# Patient Record
Sex: Male | Born: 1935 | Hispanic: No | Marital: Married | Smoking: Former smoker
Health system: Southern US, Community
[De-identification: ages and names within clinical notes are randomized; demographics above are authoritative.]

## PROBLEM LIST (undated history)

## (undated) DIAGNOSIS — H6983 Other specified disorders of Eustachian tube, bilateral: Secondary | ICD-10-CM

## (undated) DIAGNOSIS — M79662 Pain in left lower leg: Secondary | ICD-10-CM

## (undated) DIAGNOSIS — R0600 Dyspnea, unspecified: Secondary | ICD-10-CM

## (undated) DIAGNOSIS — E7800 Pure hypercholesterolemia, unspecified: Secondary | ICD-10-CM

## (undated) DIAGNOSIS — R635 Abnormal weight gain: Secondary | ICD-10-CM

## (undated) DIAGNOSIS — E785 Hyperlipidemia, unspecified: Secondary | ICD-10-CM

## (undated) DIAGNOSIS — H6123 Impacted cerumen, bilateral: Secondary | ICD-10-CM

## (undated) DIAGNOSIS — Z6828 Body mass index (BMI) 28.0-28.9, adult: Secondary | ICD-10-CM

## (undated) DIAGNOSIS — I10 Essential (primary) hypertension: Secondary | ICD-10-CM

## (undated) DIAGNOSIS — E1169 Type 2 diabetes mellitus with other specified complication: Secondary | ICD-10-CM

## (undated) DIAGNOSIS — Z8719 Personal history of other diseases of the digestive system: Secondary | ICD-10-CM

## (undated) DIAGNOSIS — N1831 Chronic kidney disease, stage 3a: Secondary | ICD-10-CM

## (undated) DIAGNOSIS — R55 Syncope and collapse: Secondary | ICD-10-CM

## (undated) DIAGNOSIS — N529 Male erectile dysfunction, unspecified: Secondary | ICD-10-CM

## (undated) DIAGNOSIS — I7090 Unspecified atherosclerosis: Secondary | ICD-10-CM

## (undated) DIAGNOSIS — H8109 Meniere's disease, unspecified ear: Secondary | ICD-10-CM

## (undated) DIAGNOSIS — I700 Atherosclerosis of aorta: Secondary | ICD-10-CM

## (undated) DIAGNOSIS — I2510 Atherosclerotic heart disease of native coronary artery without angina pectoris: Secondary | ICD-10-CM

## (undated) DIAGNOSIS — M7989 Other specified soft tissue disorders: Secondary | ICD-10-CM

## (undated) DIAGNOSIS — Z6829 Body mass index (BMI) 29.0-29.9, adult: Secondary | ICD-10-CM

## (undated) DIAGNOSIS — J309 Allergic rhinitis, unspecified: Secondary | ICD-10-CM

## (undated) DIAGNOSIS — H6993 Unspecified Eustachian tube disorder, bilateral: Secondary | ICD-10-CM

## (undated) DIAGNOSIS — I951 Orthostatic hypotension: Secondary | ICD-10-CM

## (undated) DIAGNOSIS — I6529 Occlusion and stenosis of unspecified carotid artery: Secondary | ICD-10-CM

## (undated) DIAGNOSIS — A779 Spotted fever, unspecified: Secondary | ICD-10-CM

## (undated) DIAGNOSIS — R5383 Other fatigue: Secondary | ICD-10-CM

## (undated) DIAGNOSIS — E041 Nontoxic single thyroid nodule: Secondary | ICD-10-CM

## (undated) DIAGNOSIS — R001 Bradycardia, unspecified: Secondary | ICD-10-CM

## (undated) DIAGNOSIS — K5909 Other constipation: Secondary | ICD-10-CM

## (undated) HISTORY — DX: Atherosclerosis of aorta: I70.0

## (undated) HISTORY — DX: Bradycardia, unspecified: R00.1

## (undated) HISTORY — DX: Hyperlipidemia, unspecified: E78.5

## (undated) HISTORY — DX: Meniere's disease, unspecified ear: H81.09

## (undated) HISTORY — DX: Occlusion and stenosis of unspecified carotid artery: I65.29

## (undated) HISTORY — DX: Male erectile dysfunction, unspecified: N52.9

## (undated) HISTORY — DX: Nontoxic single thyroid nodule: E04.1

## (undated) HISTORY — DX: Atherosclerotic heart disease of native coronary artery without angina pectoris: I25.10

## (undated) HISTORY — DX: Impacted cerumen, bilateral: H61.23

## (undated) HISTORY — PX: CHOLECYSTECTOMY, LAPAROSCOPIC: SHX56

## (undated) HISTORY — PX: HERNIA REPAIR: SHX51

## (undated) HISTORY — DX: Allergic rhinitis, unspecified: J30.9

## (undated) HISTORY — DX: Other constipation: K59.09

## (undated) HISTORY — DX: Other specified soft tissue disorders: M79.662

## (undated) HISTORY — DX: Other specified disorders of eustachian tube, bilateral: H69.83

## (undated) HISTORY — DX: Other fatigue: R53.83

## (undated) HISTORY — DX: Orthostatic hypotension: I95.1

## (undated) HISTORY — DX: Other specified soft tissue disorders: M79.89

## (undated) HISTORY — DX: Body mass index (BMI) 29.0-29.9, adult: Z68.29

## (undated) HISTORY — DX: Abnormal weight gain: R63.5

## (undated) HISTORY — DX: Type 2 diabetes mellitus with other specified complication: E11.69

## (undated) HISTORY — DX: Body mass index (BMI) 28.0-28.9, adult: Z68.28

## (undated) HISTORY — DX: Chronic kidney disease, stage 3a: N18.31

## (undated) HISTORY — DX: Unspecified eustachian tube disorder, bilateral: H69.93

## (undated) HISTORY — DX: Syncope and collapse: R55

## (undated) HISTORY — PX: CARDIAC CATHETERIZATION: SHX172

## (undated) HISTORY — DX: Unspecified atherosclerosis: I70.90

## (undated) HISTORY — DX: Spotted fever, unspecified: A77.9

## (undated) HISTORY — DX: Pure hypercholesterolemia, unspecified: E78.00

---

## 2015-07-11 DIAGNOSIS — J069 Acute upper respiratory infection, unspecified: Secondary | ICD-10-CM | POA: Diagnosis not present

## 2015-07-11 DIAGNOSIS — R0981 Nasal congestion: Secondary | ICD-10-CM | POA: Diagnosis not present

## 2015-09-19 DIAGNOSIS — I10 Essential (primary) hypertension: Secondary | ICD-10-CM | POA: Diagnosis not present

## 2015-09-19 DIAGNOSIS — E785 Hyperlipidemia, unspecified: Secondary | ICD-10-CM | POA: Diagnosis not present

## 2015-09-19 DIAGNOSIS — I2510 Atherosclerotic heart disease of native coronary artery without angina pectoris: Secondary | ICD-10-CM | POA: Diagnosis not present

## 2015-09-19 DIAGNOSIS — M25552 Pain in left hip: Secondary | ICD-10-CM | POA: Diagnosis not present

## 2015-09-19 DIAGNOSIS — H6123 Impacted cerumen, bilateral: Secondary | ICD-10-CM | POA: Diagnosis not present

## 2015-09-19 DIAGNOSIS — Z0000 Encounter for general adult medical examination without abnormal findings: Secondary | ICD-10-CM | POA: Diagnosis not present

## 2015-09-26 DIAGNOSIS — M545 Low back pain: Secondary | ICD-10-CM | POA: Diagnosis not present

## 2015-09-26 DIAGNOSIS — S239XXA Sprain of unspecified parts of thorax, initial encounter: Secondary | ICD-10-CM | POA: Diagnosis not present

## 2015-11-25 DIAGNOSIS — Z0000 Encounter for general adult medical examination without abnormal findings: Secondary | ICD-10-CM | POA: Diagnosis not present

## 2015-11-25 DIAGNOSIS — M791 Myalgia: Secondary | ICD-10-CM | POA: Diagnosis not present

## 2015-11-25 DIAGNOSIS — E785 Hyperlipidemia, unspecified: Secondary | ICD-10-CM | POA: Diagnosis not present

## 2015-11-25 DIAGNOSIS — Z79899 Other long term (current) drug therapy: Secondary | ICD-10-CM | POA: Diagnosis not present

## 2016-01-23 DIAGNOSIS — L259 Unspecified contact dermatitis, unspecified cause: Secondary | ICD-10-CM | POA: Diagnosis not present

## 2016-04-27 DIAGNOSIS — I10 Essential (primary) hypertension: Secondary | ICD-10-CM | POA: Diagnosis not present

## 2016-04-27 DIAGNOSIS — R10813 Right lower quadrant abdominal tenderness: Secondary | ICD-10-CM | POA: Diagnosis not present

## 2016-04-27 DIAGNOSIS — E7800 Pure hypercholesterolemia, unspecified: Secondary | ICD-10-CM | POA: Diagnosis not present

## 2016-04-27 DIAGNOSIS — R109 Unspecified abdominal pain: Secondary | ICD-10-CM | POA: Diagnosis not present

## 2016-04-27 DIAGNOSIS — Z7982 Long term (current) use of aspirin: Secondary | ICD-10-CM | POA: Diagnosis not present

## 2016-04-27 DIAGNOSIS — Z87891 Personal history of nicotine dependence: Secondary | ICD-10-CM | POA: Diagnosis not present

## 2016-04-27 DIAGNOSIS — Z888 Allergy status to other drugs, medicaments and biological substances status: Secondary | ICD-10-CM | POA: Diagnosis not present

## 2016-04-27 DIAGNOSIS — K2970 Gastritis, unspecified, without bleeding: Secondary | ICD-10-CM | POA: Diagnosis not present

## 2016-04-27 DIAGNOSIS — Z79899 Other long term (current) drug therapy: Secondary | ICD-10-CM | POA: Diagnosis not present

## 2016-04-27 DIAGNOSIS — K851 Biliary acute pancreatitis without necrosis or infection: Secondary | ICD-10-CM | POA: Diagnosis not present

## 2016-04-27 DIAGNOSIS — R1031 Right lower quadrant pain: Secondary | ICD-10-CM | POA: Diagnosis not present

## 2016-04-27 DIAGNOSIS — K8020 Calculus of gallbladder without cholecystitis without obstruction: Secondary | ICD-10-CM | POA: Diagnosis not present

## 2016-04-27 DIAGNOSIS — R1084 Generalized abdominal pain: Secondary | ICD-10-CM | POA: Diagnosis not present

## 2016-04-27 DIAGNOSIS — R112 Nausea with vomiting, unspecified: Secondary | ICD-10-CM | POA: Diagnosis not present

## 2016-04-27 DIAGNOSIS — R55 Syncope and collapse: Secondary | ICD-10-CM | POA: Diagnosis not present

## 2016-04-27 DIAGNOSIS — R0602 Shortness of breath: Secondary | ICD-10-CM | POA: Diagnosis not present

## 2016-04-27 DIAGNOSIS — K812 Acute cholecystitis with chronic cholecystitis: Secondary | ICD-10-CM | POA: Diagnosis not present

## 2016-04-27 DIAGNOSIS — R110 Nausea: Secondary | ICD-10-CM | POA: Diagnosis not present

## 2016-04-27 DIAGNOSIS — K8010 Calculus of gallbladder with chronic cholecystitis without obstruction: Secondary | ICD-10-CM | POA: Diagnosis not present

## 2016-04-27 DIAGNOSIS — K810 Acute cholecystitis: Secondary | ICD-10-CM | POA: Diagnosis not present

## 2016-04-29 DIAGNOSIS — K8010 Calculus of gallbladder with chronic cholecystitis without obstruction: Secondary | ICD-10-CM | POA: Diagnosis not present

## 2016-05-11 DIAGNOSIS — Z09 Encounter for follow-up examination after completed treatment for conditions other than malignant neoplasm: Secondary | ICD-10-CM

## 2016-05-11 HISTORY — DX: Encounter for follow-up examination after completed treatment for conditions other than malignant neoplasm: Z09

## 2016-06-17 DIAGNOSIS — K648 Other hemorrhoids: Secondary | ICD-10-CM | POA: Diagnosis not present

## 2016-06-17 DIAGNOSIS — K5730 Diverticulosis of large intestine without perforation or abscess without bleeding: Secondary | ICD-10-CM | POA: Diagnosis not present

## 2016-06-17 DIAGNOSIS — K5900 Constipation, unspecified: Secondary | ICD-10-CM | POA: Diagnosis not present

## 2016-07-29 DIAGNOSIS — K5730 Diverticulosis of large intestine without perforation or abscess without bleeding: Secondary | ICD-10-CM | POA: Diagnosis not present

## 2016-07-29 DIAGNOSIS — K648 Other hemorrhoids: Secondary | ICD-10-CM | POA: Diagnosis not present

## 2016-07-29 DIAGNOSIS — K5900 Constipation, unspecified: Secondary | ICD-10-CM | POA: Diagnosis not present

## 2016-09-02 DIAGNOSIS — E7800 Pure hypercholesterolemia, unspecified: Secondary | ICD-10-CM | POA: Diagnosis not present

## 2016-09-02 DIAGNOSIS — E8881 Metabolic syndrome: Secondary | ICD-10-CM | POA: Diagnosis not present

## 2016-09-02 DIAGNOSIS — I2510 Atherosclerotic heart disease of native coronary artery without angina pectoris: Secondary | ICD-10-CM | POA: Diagnosis not present

## 2016-09-02 DIAGNOSIS — E041 Nontoxic single thyroid nodule: Secondary | ICD-10-CM | POA: Diagnosis not present

## 2016-09-02 DIAGNOSIS — R0982 Postnasal drip: Secondary | ICD-10-CM | POA: Diagnosis not present

## 2016-09-02 DIAGNOSIS — H6123 Impacted cerumen, bilateral: Secondary | ICD-10-CM | POA: Diagnosis not present

## 2016-09-02 DIAGNOSIS — Z125 Encounter for screening for malignant neoplasm of prostate: Secondary | ICD-10-CM | POA: Diagnosis not present

## 2016-09-02 DIAGNOSIS — J309 Allergic rhinitis, unspecified: Secondary | ICD-10-CM | POA: Diagnosis not present

## 2016-09-02 DIAGNOSIS — I10 Essential (primary) hypertension: Secondary | ICD-10-CM | POA: Diagnosis not present

## 2017-04-18 DIAGNOSIS — R600 Localized edema: Secondary | ICD-10-CM | POA: Diagnosis not present

## 2017-04-18 DIAGNOSIS — S8002XA Contusion of left knee, initial encounter: Secondary | ICD-10-CM | POA: Diagnosis not present

## 2017-04-19 DIAGNOSIS — S8002XA Contusion of left knee, initial encounter: Secondary | ICD-10-CM | POA: Diagnosis not present

## 2017-04-19 DIAGNOSIS — S8992XA Unspecified injury of left lower leg, initial encounter: Secondary | ICD-10-CM | POA: Diagnosis not present

## 2017-04-19 DIAGNOSIS — M7989 Other specified soft tissue disorders: Secondary | ICD-10-CM | POA: Diagnosis not present

## 2017-04-19 DIAGNOSIS — M79662 Pain in left lower leg: Secondary | ICD-10-CM | POA: Diagnosis not present

## 2017-04-19 DIAGNOSIS — S9032XA Contusion of left foot, initial encounter: Secondary | ICD-10-CM | POA: Diagnosis not present

## 2017-04-19 DIAGNOSIS — L03116 Cellulitis of left lower limb: Secondary | ICD-10-CM | POA: Diagnosis not present

## 2017-04-26 DIAGNOSIS — J019 Acute sinusitis, unspecified: Secondary | ICD-10-CM | POA: Diagnosis not present

## 2017-04-26 DIAGNOSIS — J029 Acute pharyngitis, unspecified: Secondary | ICD-10-CM | POA: Diagnosis not present

## 2017-04-26 DIAGNOSIS — Z6830 Body mass index (BMI) 30.0-30.9, adult: Secondary | ICD-10-CM | POA: Diagnosis not present

## 2017-04-26 DIAGNOSIS — H938X1 Other specified disorders of right ear: Secondary | ICD-10-CM | POA: Diagnosis not present

## 2017-04-26 DIAGNOSIS — H6121 Impacted cerumen, right ear: Secondary | ICD-10-CM | POA: Diagnosis not present

## 2017-04-26 DIAGNOSIS — J069 Acute upper respiratory infection, unspecified: Secondary | ICD-10-CM | POA: Diagnosis not present

## 2017-05-01 DIAGNOSIS — M25569 Pain in unspecified knee: Secondary | ICD-10-CM | POA: Diagnosis not present

## 2017-05-05 DIAGNOSIS — M79662 Pain in left lower leg: Secondary | ICD-10-CM | POA: Diagnosis not present

## 2017-05-05 DIAGNOSIS — N401 Enlarged prostate with lower urinary tract symptoms: Secondary | ICD-10-CM | POA: Diagnosis not present

## 2017-05-05 DIAGNOSIS — R351 Nocturia: Secondary | ICD-10-CM | POA: Diagnosis not present

## 2017-05-05 DIAGNOSIS — M25462 Effusion, left knee: Secondary | ICD-10-CM | POA: Diagnosis not present

## 2017-05-05 DIAGNOSIS — M25569 Pain in unspecified knee: Secondary | ICD-10-CM | POA: Diagnosis not present

## 2017-05-05 DIAGNOSIS — S8992XD Unspecified injury of left lower leg, subsequent encounter: Secondary | ICD-10-CM | POA: Diagnosis not present

## 2017-05-05 DIAGNOSIS — Z6830 Body mass index (BMI) 30.0-30.9, adult: Secondary | ICD-10-CM | POA: Diagnosis not present

## 2017-05-05 DIAGNOSIS — M7989 Other specified soft tissue disorders: Secondary | ICD-10-CM | POA: Diagnosis not present

## 2017-05-05 DIAGNOSIS — E7800 Pure hypercholesterolemia, unspecified: Secondary | ICD-10-CM | POA: Diagnosis not present

## 2017-05-19 DIAGNOSIS — M79605 Pain in left leg: Secondary | ICD-10-CM | POA: Diagnosis not present

## 2017-05-19 DIAGNOSIS — M7989 Other specified soft tissue disorders: Secondary | ICD-10-CM | POA: Diagnosis not present

## 2017-05-19 DIAGNOSIS — Z6830 Body mass index (BMI) 30.0-30.9, adult: Secondary | ICD-10-CM | POA: Diagnosis not present

## 2017-05-19 DIAGNOSIS — S8992XD Unspecified injury of left lower leg, subsequent encounter: Secondary | ICD-10-CM | POA: Diagnosis not present

## 2017-05-24 DIAGNOSIS — H81399 Other peripheral vertigo, unspecified ear: Secondary | ICD-10-CM | POA: Diagnosis not present

## 2017-05-24 DIAGNOSIS — J324 Chronic pansinusitis: Secondary | ICD-10-CM | POA: Diagnosis not present

## 2017-05-24 DIAGNOSIS — S8001XA Contusion of right knee, initial encounter: Secondary | ICD-10-CM | POA: Diagnosis not present

## 2017-07-07 DIAGNOSIS — H6983 Other specified disorders of Eustachian tube, bilateral: Secondary | ICD-10-CM | POA: Diagnosis not present

## 2017-07-07 DIAGNOSIS — R42 Dizziness and giddiness: Secondary | ICD-10-CM | POA: Diagnosis not present

## 2017-07-07 DIAGNOSIS — H8109 Meniere's disease, unspecified ear: Secondary | ICD-10-CM | POA: Diagnosis not present

## 2017-07-07 DIAGNOSIS — Z6829 Body mass index (BMI) 29.0-29.9, adult: Secondary | ICD-10-CM | POA: Diagnosis not present

## 2017-07-19 DIAGNOSIS — Z6829 Body mass index (BMI) 29.0-29.9, adult: Secondary | ICD-10-CM | POA: Diagnosis not present

## 2017-07-19 DIAGNOSIS — I10 Essential (primary) hypertension: Secondary | ICD-10-CM | POA: Diagnosis not present

## 2017-09-17 DIAGNOSIS — M7061 Trochanteric bursitis, right hip: Secondary | ICD-10-CM | POA: Diagnosis not present

## 2017-09-17 DIAGNOSIS — Z6830 Body mass index (BMI) 30.0-30.9, adult: Secondary | ICD-10-CM | POA: Diagnosis not present

## 2017-09-17 DIAGNOSIS — I10 Essential (primary) hypertension: Secondary | ICD-10-CM | POA: Diagnosis not present

## 2017-09-17 DIAGNOSIS — Z1331 Encounter for screening for depression: Secondary | ICD-10-CM | POA: Diagnosis not present

## 2017-09-20 DIAGNOSIS — Z131 Encounter for screening for diabetes mellitus: Secondary | ICD-10-CM | POA: Diagnosis not present

## 2017-09-20 DIAGNOSIS — Z79899 Other long term (current) drug therapy: Secondary | ICD-10-CM | POA: Diagnosis not present

## 2017-09-20 DIAGNOSIS — E7800 Pure hypercholesterolemia, unspecified: Secondary | ICD-10-CM | POA: Diagnosis not present

## 2017-09-20 DIAGNOSIS — E785 Hyperlipidemia, unspecified: Secondary | ICD-10-CM | POA: Diagnosis not present

## 2017-10-04 DIAGNOSIS — E041 Nontoxic single thyroid nodule: Secondary | ICD-10-CM | POA: Diagnosis not present

## 2017-10-04 DIAGNOSIS — N401 Enlarged prostate with lower urinary tract symptoms: Secondary | ICD-10-CM | POA: Diagnosis not present

## 2017-10-04 DIAGNOSIS — I10 Essential (primary) hypertension: Secondary | ICD-10-CM | POA: Diagnosis not present

## 2017-10-04 DIAGNOSIS — Z0000 Encounter for general adult medical examination without abnormal findings: Secondary | ICD-10-CM | POA: Diagnosis not present

## 2017-10-04 DIAGNOSIS — E785 Hyperlipidemia, unspecified: Secondary | ICD-10-CM | POA: Diagnosis not present

## 2017-10-04 DIAGNOSIS — Z125 Encounter for screening for malignant neoplasm of prostate: Secondary | ICD-10-CM | POA: Diagnosis not present

## 2017-10-04 DIAGNOSIS — E8881 Metabolic syndrome: Secondary | ICD-10-CM | POA: Diagnosis not present

## 2017-12-07 DIAGNOSIS — Z1331 Encounter for screening for depression: Secondary | ICD-10-CM | POA: Diagnosis not present

## 2017-12-07 DIAGNOSIS — Z6831 Body mass index (BMI) 31.0-31.9, adult: Secondary | ICD-10-CM | POA: Diagnosis not present

## 2017-12-07 DIAGNOSIS — M545 Low back pain: Secondary | ICD-10-CM | POA: Diagnosis not present

## 2018-01-03 DIAGNOSIS — Z6830 Body mass index (BMI) 30.0-30.9, adult: Secondary | ICD-10-CM | POA: Diagnosis not present

## 2018-01-03 DIAGNOSIS — I10 Essential (primary) hypertension: Secondary | ICD-10-CM | POA: Diagnosis not present

## 2018-01-03 DIAGNOSIS — M545 Low back pain: Secondary | ICD-10-CM | POA: Diagnosis not present

## 2018-01-03 DIAGNOSIS — E7800 Pure hypercholesterolemia, unspecified: Secondary | ICD-10-CM | POA: Diagnosis not present

## 2018-04-18 DIAGNOSIS — E8881 Metabolic syndrome: Secondary | ICD-10-CM | POA: Diagnosis not present

## 2018-04-18 DIAGNOSIS — N401 Enlarged prostate with lower urinary tract symptoms: Secondary | ICD-10-CM | POA: Diagnosis not present

## 2018-04-18 DIAGNOSIS — H9203 Otalgia, bilateral: Secondary | ICD-10-CM | POA: Diagnosis not present

## 2018-04-18 DIAGNOSIS — I10 Essential (primary) hypertension: Secondary | ICD-10-CM | POA: Diagnosis not present

## 2018-04-18 DIAGNOSIS — I2510 Atherosclerotic heart disease of native coronary artery without angina pectoris: Secondary | ICD-10-CM | POA: Diagnosis not present

## 2018-04-18 DIAGNOSIS — R351 Nocturia: Secondary | ICD-10-CM | POA: Diagnosis not present

## 2018-04-18 DIAGNOSIS — R635 Abnormal weight gain: Secondary | ICD-10-CM | POA: Diagnosis not present

## 2018-04-18 DIAGNOSIS — Z6831 Body mass index (BMI) 31.0-31.9, adult: Secondary | ICD-10-CM | POA: Diagnosis not present

## 2018-04-18 DIAGNOSIS — M545 Low back pain: Secondary | ICD-10-CM | POA: Diagnosis not present

## 2018-06-16 DIAGNOSIS — J111 Influenza due to unidentified influenza virus with other respiratory manifestations: Secondary | ICD-10-CM | POA: Diagnosis not present

## 2018-06-16 DIAGNOSIS — Z6831 Body mass index (BMI) 31.0-31.9, adult: Secondary | ICD-10-CM | POA: Diagnosis not present

## 2018-11-04 DIAGNOSIS — E7800 Pure hypercholesterolemia, unspecified: Secondary | ICD-10-CM | POA: Diagnosis not present

## 2018-11-04 DIAGNOSIS — Z79899 Other long term (current) drug therapy: Secondary | ICD-10-CM | POA: Diagnosis not present

## 2018-11-04 DIAGNOSIS — Z0000 Encounter for general adult medical examination without abnormal findings: Secondary | ICD-10-CM | POA: Diagnosis not present

## 2018-11-04 DIAGNOSIS — E785 Hyperlipidemia, unspecified: Secondary | ICD-10-CM | POA: Diagnosis not present

## 2018-11-04 DIAGNOSIS — Z125 Encounter for screening for malignant neoplasm of prostate: Secondary | ICD-10-CM | POA: Diagnosis not present

## 2018-11-04 DIAGNOSIS — E041 Nontoxic single thyroid nodule: Secondary | ICD-10-CM | POA: Diagnosis not present

## 2018-11-04 DIAGNOSIS — I2510 Atherosclerotic heart disease of native coronary artery without angina pectoris: Secondary | ICD-10-CM | POA: Diagnosis not present

## 2018-11-04 DIAGNOSIS — I10 Essential (primary) hypertension: Secondary | ICD-10-CM | POA: Diagnosis not present

## 2018-11-04 DIAGNOSIS — E8881 Metabolic syndrome: Secondary | ICD-10-CM | POA: Diagnosis not present

## 2018-11-24 DIAGNOSIS — Z6831 Body mass index (BMI) 31.0-31.9, adult: Secondary | ICD-10-CM | POA: Diagnosis not present

## 2018-11-24 DIAGNOSIS — K5900 Constipation, unspecified: Secondary | ICD-10-CM | POA: Diagnosis not present

## 2018-11-30 DIAGNOSIS — K5730 Diverticulosis of large intestine without perforation or abscess without bleeding: Secondary | ICD-10-CM | POA: Diagnosis not present

## 2018-11-30 DIAGNOSIS — K5900 Constipation, unspecified: Secondary | ICD-10-CM | POA: Diagnosis not present

## 2018-11-30 DIAGNOSIS — R140 Abdominal distension (gaseous): Secondary | ICD-10-CM | POA: Diagnosis not present

## 2018-11-30 DIAGNOSIS — Z6831 Body mass index (BMI) 31.0-31.9, adult: Secondary | ICD-10-CM | POA: Diagnosis not present

## 2018-11-30 DIAGNOSIS — R1906 Epigastric swelling, mass or lump: Secondary | ICD-10-CM | POA: Diagnosis not present

## 2019-02-22 DIAGNOSIS — Z6831 Body mass index (BMI) 31.0-31.9, adult: Secondary | ICD-10-CM | POA: Diagnosis not present

## 2019-02-22 DIAGNOSIS — H8109 Meniere's disease, unspecified ear: Secondary | ICD-10-CM | POA: Diagnosis not present

## 2019-02-22 DIAGNOSIS — I951 Orthostatic hypotension: Secondary | ICD-10-CM | POA: Diagnosis not present

## 2019-02-22 DIAGNOSIS — Z1331 Encounter for screening for depression: Secondary | ICD-10-CM | POA: Diagnosis not present

## 2019-02-22 DIAGNOSIS — R197 Diarrhea, unspecified: Secondary | ICD-10-CM | POA: Diagnosis not present

## 2019-03-08 DIAGNOSIS — Z6831 Body mass index (BMI) 31.0-31.9, adult: Secondary | ICD-10-CM | POA: Diagnosis not present

## 2019-03-08 DIAGNOSIS — I951 Orthostatic hypotension: Secondary | ICD-10-CM | POA: Diagnosis not present

## 2019-03-08 DIAGNOSIS — E8881 Metabolic syndrome: Secondary | ICD-10-CM | POA: Diagnosis not present

## 2019-03-08 DIAGNOSIS — R197 Diarrhea, unspecified: Secondary | ICD-10-CM | POA: Diagnosis not present

## 2019-04-08 DIAGNOSIS — Z20828 Contact with and (suspected) exposure to other viral communicable diseases: Secondary | ICD-10-CM | POA: Diagnosis not present

## 2019-04-08 DIAGNOSIS — R519 Headache, unspecified: Secondary | ICD-10-CM | POA: Diagnosis not present

## 2019-04-14 DIAGNOSIS — I10 Essential (primary) hypertension: Secondary | ICD-10-CM | POA: Diagnosis not present

## 2019-04-14 DIAGNOSIS — R0902 Hypoxemia: Secondary | ICD-10-CM | POA: Diagnosis not present

## 2019-04-14 DIAGNOSIS — R410 Disorientation, unspecified: Secondary | ICD-10-CM | POA: Diagnosis not present

## 2019-04-14 DIAGNOSIS — E7800 Pure hypercholesterolemia, unspecified: Secondary | ICD-10-CM | POA: Diagnosis not present

## 2019-04-14 DIAGNOSIS — U071 COVID-19: Secondary | ICD-10-CM | POA: Diagnosis not present

## 2019-04-14 DIAGNOSIS — Z888 Allergy status to other drugs, medicaments and biological substances status: Secondary | ICD-10-CM | POA: Diagnosis not present

## 2019-04-14 DIAGNOSIS — R0602 Shortness of breath: Secondary | ICD-10-CM | POA: Diagnosis not present

## 2019-04-14 DIAGNOSIS — J1289 Other viral pneumonia: Secondary | ICD-10-CM | POA: Diagnosis not present

## 2019-04-14 DIAGNOSIS — R069 Unspecified abnormalities of breathing: Secondary | ICD-10-CM | POA: Diagnosis not present

## 2019-04-14 DIAGNOSIS — Z79899 Other long term (current) drug therapy: Secondary | ICD-10-CM | POA: Diagnosis not present

## 2019-04-14 DIAGNOSIS — Z7982 Long term (current) use of aspirin: Secondary | ICD-10-CM | POA: Diagnosis not present

## 2019-04-14 DIAGNOSIS — J189 Pneumonia, unspecified organism: Secondary | ICD-10-CM | POA: Diagnosis not present

## 2019-04-14 DIAGNOSIS — R000 Tachycardia, unspecified: Secondary | ICD-10-CM | POA: Diagnosis not present

## 2019-04-14 DIAGNOSIS — J9601 Acute respiratory failure with hypoxia: Secondary | ICD-10-CM | POA: Diagnosis not present

## 2019-04-14 DIAGNOSIS — R918 Other nonspecific abnormal finding of lung field: Secondary | ICD-10-CM | POA: Diagnosis not present

## 2019-04-16 ENCOUNTER — Encounter (HOSPITAL_COMMUNITY): Payer: Self-pay | Admitting: Internal Medicine

## 2019-04-16 ENCOUNTER — Inpatient Hospital Stay (HOSPITAL_COMMUNITY)
Admission: AD | Admit: 2019-04-16 | Discharge: 2019-04-20 | DRG: 177 | Disposition: A | Discharge status: Home Health | Payer: Medicare HMO | Source: Other Acute Inpatient Hospital | Attending: Internal Medicine | Admitting: Internal Medicine

## 2019-04-16 ENCOUNTER — Other Ambulatory Visit: Payer: Self-pay

## 2019-04-16 DIAGNOSIS — I509 Heart failure, unspecified: Secondary | ICD-10-CM | POA: Diagnosis not present

## 2019-04-16 DIAGNOSIS — M2550 Pain in unspecified joint: Secondary | ICD-10-CM | POA: Diagnosis not present

## 2019-04-16 DIAGNOSIS — J1282 Pneumonia due to coronavirus disease 2019: Secondary | ICD-10-CM | POA: Diagnosis not present

## 2019-04-16 DIAGNOSIS — J9600 Acute respiratory failure, unspecified whether with hypoxia or hypercapnia: Secondary | ICD-10-CM

## 2019-04-16 DIAGNOSIS — R0902 Hypoxemia: Secondary | ICD-10-CM | POA: Diagnosis not present

## 2019-04-16 DIAGNOSIS — R52 Pain, unspecified: Secondary | ICD-10-CM | POA: Diagnosis not present

## 2019-04-16 DIAGNOSIS — I2510 Atherosclerotic heart disease of native coronary artery without angina pectoris: Secondary | ICD-10-CM | POA: Diagnosis present

## 2019-04-16 DIAGNOSIS — J9601 Acute respiratory failure with hypoxia: Secondary | ICD-10-CM | POA: Diagnosis not present

## 2019-04-16 DIAGNOSIS — R0602 Shortness of breath: Secondary | ICD-10-CM | POA: Diagnosis not present

## 2019-04-16 DIAGNOSIS — U071 COVID-19: Secondary | ICD-10-CM | POA: Diagnosis not present

## 2019-04-16 DIAGNOSIS — E7800 Pure hypercholesterolemia, unspecified: Secondary | ICD-10-CM | POA: Diagnosis not present

## 2019-04-16 DIAGNOSIS — Z888 Allergy status to other drugs, medicaments and biological substances status: Secondary | ICD-10-CM | POA: Diagnosis not present

## 2019-04-16 DIAGNOSIS — R5381 Other malaise: Secondary | ICD-10-CM | POA: Diagnosis not present

## 2019-04-16 DIAGNOSIS — E785 Hyperlipidemia, unspecified: Secondary | ICD-10-CM | POA: Diagnosis present

## 2019-04-16 DIAGNOSIS — I110 Hypertensive heart disease with heart failure: Secondary | ICD-10-CM | POA: Diagnosis not present

## 2019-04-16 DIAGNOSIS — R531 Weakness: Secondary | ICD-10-CM | POA: Diagnosis not present

## 2019-04-16 DIAGNOSIS — G92 Toxic encephalopathy: Secondary | ICD-10-CM | POA: Diagnosis not present

## 2019-04-16 DIAGNOSIS — Z87891 Personal history of nicotine dependence: Secondary | ICD-10-CM | POA: Diagnosis not present

## 2019-04-16 DIAGNOSIS — I10 Essential (primary) hypertension: Secondary | ICD-10-CM | POA: Diagnosis present

## 2019-04-16 DIAGNOSIS — K449 Diaphragmatic hernia without obstruction or gangrene: Secondary | ICD-10-CM | POA: Diagnosis not present

## 2019-04-16 DIAGNOSIS — I959 Hypotension, unspecified: Secondary | ICD-10-CM | POA: Diagnosis present

## 2019-04-16 DIAGNOSIS — Z743 Need for continuous supervision: Secondary | ICD-10-CM | POA: Diagnosis not present

## 2019-04-16 DIAGNOSIS — R279 Unspecified lack of coordination: Secondary | ICD-10-CM | POA: Diagnosis not present

## 2019-04-16 DIAGNOSIS — Z7401 Bed confinement status: Secondary | ICD-10-CM | POA: Diagnosis not present

## 2019-04-16 HISTORY — DX: Atherosclerotic heart disease of native coronary artery without angina pectoris: I25.10

## 2019-04-16 HISTORY — DX: Dyspnea, unspecified: R06.00

## 2019-04-16 HISTORY — DX: Personal history of other diseases of the digestive system: Z87.19

## 2019-04-16 HISTORY — DX: Hyperlipidemia, unspecified: E78.5

## 2019-04-16 HISTORY — DX: Essential (primary) hypertension: I10

## 2019-04-16 HISTORY — DX: Pneumonia due to coronavirus disease 2019: J12.82

## 2019-04-16 HISTORY — DX: COVID-19: U07.1

## 2019-04-16 MED ORDER — ACETAMINOPHEN 325 MG PO TABS: 650.0000 mg | ORAL_TABLET | Freq: Four times a day (QID) | ORAL | Status: DC | PRN

## 2019-04-16 MED ORDER — ASCORBIC ACID 500 MG PO TABS
500.0000 mg | ORAL_TABLET | Freq: Every day | ORAL | Status: DC
  Administered 2019-04-17 – 2019-04-20 (×4): 500 mg via ORAL
  Filled 2019-04-16 (×4): qty 1

## 2019-04-16 MED ORDER — SODIUM CHLORIDE 0.9 % IV SOLN: 100.0000 mg | Freq: Every day | INTRAVENOUS | Status: DC

## 2019-04-16 MED ORDER — GUAIFENESIN-DM 100-10 MG/5ML PO SYRP
10.0000 mL | ORAL_SOLUTION | ORAL | Status: DC | PRN
  Administered 2019-04-19: 10 mL via ORAL
  Filled 2019-04-16: qty 10

## 2019-04-16 MED ORDER — SODIUM CHLORIDE 0.9% FLUSH
3.0000 mL | Freq: Two times a day (BID) | INTRAVENOUS | Status: DC
  Administered 2019-04-16: 3 mL via INTRAVENOUS

## 2019-04-16 MED ORDER — ONDANSETRON HCL 4 MG/2ML IJ SOLN: 4.0000 mg | Freq: Four times a day (QID) | INTRAMUSCULAR | Status: DC | PRN

## 2019-04-16 MED ORDER — SODIUM CHLORIDE 0.9 % IV SOLN: 200.0000 mg | Freq: Once | INTRAVENOUS | Status: DC

## 2019-04-16 MED ORDER — ZINC SULFATE 220 (50 ZN) MG PO CAPS
220.0000 mg | ORAL_CAPSULE | Freq: Every day | ORAL | Status: DC
  Administered 2019-04-17 – 2019-04-20 (×4): 220 mg via ORAL
  Filled 2019-04-16 (×4): qty 1

## 2019-04-16 MED ORDER — ENOXAPARIN SODIUM 40 MG/0.4ML ~~LOC~~ SOLN: 40.0000 mg | SUBCUTANEOUS | Status: DC

## 2019-04-16 MED ORDER — ONDANSETRON HCL 4 MG PO TABS: 4.0000 mg | ORAL_TABLET | Freq: Four times a day (QID) | ORAL | Status: DC | PRN

## 2019-04-16 MED ORDER — DEXAMETHASONE SODIUM PHOSPHATE 10 MG/ML IJ SOLN
6.0000 mg | INTRAMUSCULAR | Status: DC
  Administered 2019-04-17 – 2019-04-18 (×2): 6 mg via INTRAVENOUS
  Filled 2019-04-16: qty 1

## 2019-04-16 MED ORDER — SODIUM CHLORIDE 0.9 % IV SOLN
100.0000 mg | Freq: Every day | INTRAVENOUS | Status: AC
  Administered 2019-04-17 – 2019-04-19 (×3): 100 mg via INTRAVENOUS
  Filled 2019-04-16 (×3): qty 20

## 2019-04-16 MED ORDER — SODIUM CHLORIDE 0.9% FLUSH: 3.0000 mL | INTRAVENOUS | Status: DC | PRN

## 2019-04-16 MED ORDER — HYDROCOD POLST-CPM POLST ER 10-8 MG/5ML PO SUER
5.0000 mL | Freq: Two times a day (BID) | ORAL | Status: DC | PRN
  Administered 2019-04-20: 5 mL via ORAL
  Filled 2019-04-16: qty 5

## 2019-04-16 MED ORDER — SODIUM CHLORIDE 0.9 % IV SOLN: 250.0000 mL | INTRAVENOUS | Status: DC | PRN

## 2019-04-16 NOTE — Progress Notes (Signed)
Spoke to Adam Brady at [facility] to notify that patient needs Covid positive result faxed over prior to admission to [facility].

## 2019-04-17 ENCOUNTER — Inpatient Hospital Stay (HOSPITAL_COMMUNITY): Payer: Medicare HMO

## 2019-04-17 DIAGNOSIS — U071 COVID-19: Principal | ICD-10-CM

## 2019-04-17 DIAGNOSIS — J1282 Pneumonia due to coronavirus disease 2019: Secondary | ICD-10-CM

## 2019-04-17 LAB — CBC
HCT: 47.3 % (ref 39.0–52.0)
Hemoglobin: 16.2 g/dL (ref 13.0–17.0)
MCH: 32.3 pg (ref 26.0–34.0)
MCHC: 34.2 g/dL (ref 30.0–36.0)
MCV: 94.4 fL (ref 80.0–100.0)
Platelets: 282 10*3/uL (ref 150–400)
RBC: 5.01 MIL/uL (ref 4.22–5.81)
RDW: 12.9 % (ref 11.5–15.5)
WBC: 9.5 10*3/uL (ref 4.0–10.5)
nRBC: 0 % (ref 0.0–0.2)

## 2019-04-17 LAB — URINALYSIS, ROUTINE W REFLEX MICROSCOPIC
Bacteria, UA: NONE SEEN
Bilirubin Urine: NEGATIVE
Glucose, UA: >=500 mg/dL — AB
Hgb urine dipstick: NEGATIVE
Ketones, ur: NEGATIVE mg/dL
Leukocytes,Ua: NEGATIVE
Nitrite: NEGATIVE
Protein, ur: NEGATIVE mg/dL
Specific Gravity, Urine: 1.021 (ref 1.005–1.030)
pH: 6 (ref 5.0–8.0)

## 2019-04-17 LAB — TYPE AND SCREEN
ABO/RH(D): O POS
Antibody Screen: NEGATIVE

## 2019-04-17 LAB — HEPATITIS B SURFACE ANTIGEN: Hepatitis B Surface Ag: NONREACTIVE

## 2019-04-17 LAB — BASIC METABOLIC PANEL
Anion gap: 9 (ref 5–15)
BUN: 23 mg/dL (ref 8–23)
CO2: 26 mmol/L (ref 22–32)
Calcium: 9.1 mg/dL (ref 8.9–10.3)
Chloride: 101 mmol/L (ref 98–111)
Creatinine, Ser: 0.95 mg/dL (ref 0.61–1.24)
GFR calc Af Amer: >60 mL/min (ref >60)
GFR calc non Af Amer: >60 mL/min (ref >60)
Glucose, Bld: 342 mg/dL — ABNORMAL HIGH (ref 70–99)
Potassium: 3.8 mmol/L (ref 3.5–5.1)
Sodium: 136 mmol/L (ref 135–145)

## 2019-04-17 LAB — D-DIMER, QUANTITATIVE: D-Dimer, Quant: 3.36 ug/mL-FEU — ABNORMAL HIGH (ref 0.00–0.50)

## 2019-04-17 LAB — MAGNESIUM: Magnesium: 2 mg/dL (ref 1.7–2.4)

## 2019-04-17 LAB — PROCALCITONIN: Procalcitonin: <0.1 ng/mL

## 2019-04-17 LAB — ABO/RH: ABO/RH(D): O POS

## 2019-04-17 LAB — C-REACTIVE PROTEIN: CRP: 2.6 mg/dL — ABNORMAL HIGH (ref <1.0)

## 2019-04-17 LAB — BRAIN NATRIURETIC PEPTIDE: B Natriuretic Peptide: 61.8 pg/mL (ref 0.0–100.0)

## 2019-04-17 LAB — GLUCOSE, CAPILLARY
Glucose-Capillary: 347 mg/dL — ABNORMAL HIGH (ref 70–99)
Glucose-Capillary: 287 mg/dL — ABNORMAL HIGH (ref 70–99)

## 2019-04-17 MED ORDER — TAMSULOSIN HCL 0.4 MG PO CAPS
0.4000 mg | ORAL_CAPSULE | Freq: Every day | ORAL | Status: DC
  Administered 2019-04-17 – 2019-04-20 (×4): 0.4 mg via ORAL
  Filled 2019-04-17 (×4): qty 1

## 2019-04-17 MED ORDER — ENOXAPARIN SODIUM 60 MG/0.6ML ~~LOC~~ SOLN
50.0000 mg | Freq: Two times a day (BID) | SUBCUTANEOUS | Status: DC
  Administered 2019-04-17: 50 mg via SUBCUTANEOUS
  Filled 2019-04-17: qty 0.6

## 2019-04-17 MED ORDER — ENOXAPARIN SODIUM 60 MG/0.6ML ~~LOC~~ SOLN
50.0000 mg | SUBCUTANEOUS | Status: DC
  Administered 2019-04-17 – 2019-04-18 (×2): 50 mg via SUBCUTANEOUS
  Filled 2019-04-17 (×2): qty 0.6

## 2019-04-17 MED ORDER — INSULIN ASPART 100 UNIT/ML ~~LOC~~ SOLN
0.0000 [IU] | Freq: Every day | SUBCUTANEOUS | Status: DC
  Administered 2019-04-17: 3 [IU] via SUBCUTANEOUS
  Administered 2019-04-18: 5 [IU] via SUBCUTANEOUS
  Administered 2019-04-19: 2 [IU] via SUBCUTANEOUS

## 2019-04-17 MED ORDER — LACTATED RINGERS IV SOLN: INTRAVENOUS | Status: AC

## 2019-04-17 MED ORDER — LORAZEPAM 2 MG/ML IJ SOLN
0.5000 mg | Freq: Once | INTRAMUSCULAR | Status: AC
  Administered 2019-04-17: 0.5 mg via INTRAVENOUS
  Filled 2019-04-17: qty 1

## 2019-04-17 MED ORDER — HALOPERIDOL LACTATE 5 MG/ML IJ SOLN
2.0000 mg | Freq: Four times a day (QID) | INTRAMUSCULAR | Status: DC | PRN
  Administered 2019-04-17: 2 mg via INTRAVENOUS
  Filled 2019-04-17: qty 1

## 2019-04-17 MED ORDER — SIMVASTATIN 20 MG PO TABS
20.0000 mg | ORAL_TABLET | Freq: Every day | ORAL | Status: DC
  Administered 2019-04-18 – 2019-04-19 (×2): 20 mg via ORAL
  Filled 2019-04-17 (×2): qty 1

## 2019-04-17 MED ORDER — ASPIRIN 81 MG PO CHEW
81.0000 mg | CHEWABLE_TABLET | Freq: Every day | ORAL | Status: DC
  Administered 2019-04-17 – 2019-04-20 (×4): 81 mg via ORAL
  Filled 2019-04-17 (×4): qty 1

## 2019-04-17 MED ORDER — HALOPERIDOL LACTATE 5 MG/ML IJ SOLN
2.0000 mg | Freq: Once | INTRAMUSCULAR | Status: AC
  Administered 2019-04-17: 2 mg via INTRAVENOUS
  Filled 2019-04-17: qty 1

## 2019-04-17 MED ORDER — INSULIN ASPART 100 UNIT/ML ~~LOC~~ SOLN
0.0000 [IU] | Freq: Three times a day (TID) | SUBCUTANEOUS | Status: DC
  Administered 2019-04-17: 11 [IU] via SUBCUTANEOUS
  Administered 2019-04-18: 5 [IU] via SUBCUTANEOUS
  Administered 2019-04-18: 3 [IU] via SUBCUTANEOUS
  Administered 2019-04-18 – 2019-04-19 (×2): 11 [IU] via SUBCUTANEOUS
  Administered 2019-04-19 (×2): 3 [IU] via SUBCUTANEOUS
  Administered 2019-04-20: 2 [IU] via SUBCUTANEOUS
  Administered 2019-04-20: 8 [IU] via SUBCUTANEOUS

## 2019-04-17 MED ORDER — MIDODRINE HCL 5 MG PO TABS
5.0000 mg | ORAL_TABLET | Freq: Two times a day (BID) | ORAL | Status: AC
  Administered 2019-04-17 – 2019-04-18 (×4): 5 mg via ORAL
  Filled 2019-04-17 (×4): qty 1

## 2019-04-17 MED ORDER — FLUTICASONE PROPIONATE 50 MCG/ACT NA SUSP
2.0000 | Freq: Every day | NASAL | Status: DC | PRN
  Filled 2019-04-17: qty 16

## 2019-04-17 NOTE — Plan of Care (Signed)
  Problem: Education: Goal: Knowledge of General Education information will improve Description: Including pain rating scale, medication(s)/side effects and non-pharmacologic comfort measures 04/17/2019 1236 by Garrison Columbus, RN Outcome: Progressing 04/17/2019 1232 by Garrison Columbus, RN Outcome: Progressing   Problem: Health Behavior/Discharge Planning: Goal: Ability to manage health-related needs will improve 04/17/2019 1236 by Garrison Columbus, RN Outcome: Progressing 04/17/2019 1232 by Garrison Columbus, RN Outcome: Progressing   Problem: Clinical Measurements: Goal: Ability to maintain clinical measurements within normal limits will improve 04/17/2019 1236 by Garrison Columbus, RN Outcome: Progressing 04/17/2019 1232 by Garrison Columbus, RN Outcome: Progressing Goal: Will remain free from infection 04/17/2019 1236 by Garrison Columbus, RN Outcome: Progressing 04/17/2019 1232 by Garrison Columbus, RN Outcome: Progressing Goal: Diagnostic test results will improve 04/17/2019 1236 by Garrison Columbus, RN Outcome: Progressing 04/17/2019 1232 by Garrison Columbus, RN Outcome: Progressing Goal: Respiratory complications will improve 04/17/2019 1236 by Garrison Columbus, RN Outcome: Progressing 04/17/2019 1232 by Garrison Columbus, RN Outcome: Progressing Goal: Cardiovascular complication will be avoided 04/17/2019 1236 by Garrison Columbus, RN Outcome: Progressing 04/17/2019 1232 by Garrison Columbus, RN Outcome: Progressing   Problem: Activity: Goal: Risk for activity intolerance will decrease 04/17/2019 1236 by Garrison Columbus, RN Outcome: Progressing 04/17/2019 1232 by Garrison Columbus, RN Outcome: Progressing   Problem: Nutrition: Goal: Adequate nutrition will be maintained 04/17/2019 1236 by Garrison Columbus, RN Outcome: Progressing 04/17/2019 1232 by Garrison Columbus, RN Outcome: Progressing   Problem: Coping: Goal: Level of anxiety will decrease 04/17/2019 1236 by Garrison Columbus, RN Outcome: Progressing 04/17/2019 1232 by Garrison Columbus, RN Outcome: Progressing   Problem: Elimination: Goal: Will not experience complications related to bowel motility 04/17/2019 1236 by Garrison Columbus, RN Outcome: Progressing 04/17/2019 1232 by Garrison Columbus, RN Outcome: Progressing Goal: Will not experience complications related to urinary retention 04/17/2019 1236 by Garrison Columbus, RN Outcome: Progressing 04/17/2019 1232 by Garrison Columbus, RN Outcome: Progressing   Problem: Pain Managment: Goal: General experience of comfort will improve 04/17/2019 1236 by Garrison Columbus, RN Outcome: Progressing 04/17/2019 1232 by Garrison Columbus, RN Outcome: Progressing   Problem: Safety: Goal: Ability to remain free from injury will improve 04/17/2019 1236 by Garrison Columbus, RN Outcome: Progressing 04/17/2019 1232 by Garrison Columbus, RN Outcome: Progressing   Problem: Skin Integrity: Goal: Risk for impaired skin integrity will decrease 04/17/2019 1236 by Garrison Columbus, RN Outcome: Progressing 04/17/2019 1232 by Garrison Columbus, RN Outcome: Progressing   Problem: Education: Goal: Knowledge of risk factors and measures for prevention of condition will improve 04/17/2019 1236 by Garrison Columbus, RN Outcome: Progressing 04/17/2019 1232 by Garrison Columbus, RN Outcome: Progressing   Problem: Coping: Goal: Psychosocial and spiritual needs will be supported 04/17/2019 1236 by Garrison Columbus, RN Outcome: Progressing 04/17/2019 1232 by Garrison Columbus, RN Outcome: Progressing   Problem: Respiratory: Goal: Will maintain a patent airway 04/17/2019 1236 by Garrison Columbus, RN Outcome: Progressing 04/17/2019 1232 by Garrison Columbus, RN Outcome: Progressing Goal: Complications related to the disease process, condition or treatment will be avoided or minimized 04/17/2019 1236 by Garrison Columbus, RN Outcome: Progressing 04/17/2019 1232 by Garrison Columbus, RN Outcome: Progressing

## 2019-04-17 NOTE — Progress Notes (Signed)
Singh,MD notified of pt bladder scan of 656, MD placed order for foley and started pt on flomax.  Pt urinated prior to placing foley, MD notified and said not to place foley but instead to bladder scan Qshift.

## 2019-04-17 NOTE — Evaluation (Signed)
Physical Therapy Evaluation Patient Details Name: Adam Brady MRN: [id number] DOB: 1933-12-24 Today's Date: 04/17/2019   History of Present Illness  Adam Brady is a 86 y.o. male with medical history significant of hypertension, hyperlipidemia, coronary artery disease admitted for shortness of breath was 80% on room air initially had been on 4 L of oxygen with O2 sats of 90%.  Patient was admitted to [facility] received remdesivir Decadron and Actemra x1.  He progressively got worse requiring more oxygen needs 5 to 6 L and became more confused transferred to [facility] for further management.  Clinical Impression  The patient presents with significant change from being independent PTA, per son. . The patient was pleasant, assisted to sitting  On bed edge with decreased balance. Unable to attempt standing as needing 2 to assist, patient also becoming slightly more restless.   Assisted back to bed and secured the soft wrist restraints for patient safety. Sitter at bedside. Pt admitted with above diagnosis.  Pt currently with functional limitations due to the deficits listed below (see PT Problem List). Pt will benefit from skilled PT to increase their independence and safety with mobility to allow discharge to the venue listed below.       Follow Up Recommendations Home health PT;SNF(depends on progress)    Equipment Recommendations  Rolling walker with 5" wheels    Recommendations for Other Services       Precautions / Restrictions Precautions Precautions: Fall Precaution Comments: Has been confused and agitated, was not so much with PT this visit      Mobility  Bed Mobility Overal bed mobility: Needs Assistance Bed Mobility: Supine to Sit;Sit to Supine     Supine to sit: Mod assist Sit to supine: Min assist   General bed mobility comments: extra time and multimodal cues to sit up to bed edge after wrists released. Required assistance  for  trunk as tending to lean posteriorly  and never did sit near midline. patient did place legs onto bed to return to supine and able to slide himself up to [facility].  Transfers                 General transfer comment: did not attempt due to needing +2 and posterior leaning.  Ambulation/Gait                Stairs            Wheelchair Mobility    Modified Rankin (Stroke Patients Only)       Balance Overall balance assessment: Needs assistance Sitting-balance support: Bilateral upper extremity supported;Feet supported Sitting balance-Leahy Scale: Poor Sitting balance - Comments: leaning posteriorly attempted forward reaching. Postural control: Posterior lean                                   Pertinent Vitals/Pain      Home Living Family/patient expects to be discharged to:: Private residence Living Arrangements: Spouse/significant other Available Help at Discharge: Family Type of Home: House Home Access: Stairs to enter Entrance Stairs-Rails: Can reach both Entrance Stairs-Number of Steps: 6-back, front 1/2 atep Home Layout: One level Home Equipment: None      Prior Function Level of Independence: Independent         Comments: drives, recently remarried     Hand Dominance   Dominant Hand: Right    Extremity/Trunk Assessment   Upper Extremity Assessment Upper Extremity Assessment: Defer to OT  evaluation    Lower Extremity Assessment Lower Extremity Assessment: Generalized weakness    Cervical / Trunk Assessment Cervical / Trunk Assessment: Normal  Communication   Communication: No difficulties  Cognition     Overall Cognitive Status: Impaired/Different from baseline Area of Impairment: Orientation;Attention;Memory;Following commands;Safety/judgement;Awareness                 Orientation Level: Place;Time;Situation Current Attention Level: Selective Memory: Decreased recall of precautions Following Commands: Follows one step commands with increased  time Safety/Judgement: Decreased awareness of deficits;Decreased awareness of safety Awareness: Emergent   General Comments: patient stated it was january, 2020, He was in [facility], appeared to be describing " [facility] but could not make sense., At times asking why his hands were in a restraint, explaained about safety      General Comments      Exercises     Assessment/Plan    PT Assessment Patient needs continued PT services  PT Problem List Decreased strength;Decreased mobility;Decreased safety awareness;Decreased knowledge of precautions;Decreased activity tolerance;Decreased cognition;Decreased balance       PT Treatment Interventions DME instruction;Therapeutic activities;Cognitive remediation;Gait training;Therapeutic exercise;Patient/family education;Functional mobility training    PT Goals (Current goals can be found in the Care Plan section)  Acute Rehab PT Goals Patient Stated Goal: agreed to sitting up PT Goal Formulation: With patient/family Time For Goal Achievement: 05/01/19 Potential to Achieve Goals: Fair    Frequency Min 3X/week(change if SNF is  needed)   Barriers to discharge        Co-evaluation               AM-PAC PT "6 Clicks" Mobility  Outcome Measure Help needed turning from your back to your side while in a flat bed without using bedrails?: A Lot Help needed moving from lying on your back to sitting on the side of a flat bed without using bedrails?: A Lot Help needed moving to and from a bed to a chair (including a wheelchair)?: A Lot Help needed standing up from a chair using your arms (e.g., wheelchair or bedside chair)?: A Lot Help needed to walk in hospital room?: Total Help needed climbing 3-5 steps with a railing? : Total 6 Click Score: 10    End of Session Equipment Utilized During Treatment: Oxygen Activity Tolerance: Patient tolerated treatment well Patient left: in bed;with call bell/phone within reach;with  nursing/sitter in room Nurse Communication: Mobility status PT Visit Diagnosis: Unsteadiness on feet (R26.81);Difficulty in walking, not elsewhere classified (R26.2)    Time: 7412-8786 PT Time Calculation (min) (ACUTE ONLY): 28 min   Charges:   PT Evaluation $PT Eval Moderate Complexity: 1 Mod PT Treatments $Therapeutic Activity: 8-22 mins         Blanchard Kelch PT Acute Rehabilitation Services Pager [phone] Office [phone]   Rada Hay 04/17/2019, 1:52 PM

## 2019-04-17 NOTE — Progress Notes (Signed)
PROGRESS NOTE                                                                                                                                                                                                             Patient Demographics:    Adam Brady, is a 86 y.o. male, DOB - [birth year], PPJ:[id number]  Outpatient Primary MD for the patient is Patient, No Pcp Per    LOS - 1  Admit date - 04/16/2019    CC - SOB     Brief Narrative this is a 86 year old Caucasian male who lives in [facility], has past medical history of central hypertension, CAD, dyslipidemia who was diagnosed with COVID-19 pneumonia about 5 to 6 days prior to hospitalization at [facility], he developed some shortness of breath and was admitted to [facility].  He received appropriate treatment with IV remdesivir, steroids and Actemra.  Subsequently he improved but then on day 4 he started developing more shortness of breath and was sent to [facility] for further treatment   Subjective:    Salley Slaughter today in bed, somewhat confused but comfortable, did unreliable historian but denies any headache chest or abdominal pain or shortness of breath.   Assessment  & Plan :    Principal Problem:   Pneumonia due to COVID-19 virus Active Problems:   Acute respiratory failure due to COVID-19 (HCC)   HTN (hypertension)   HLD (hyperlipidemia)   CAD (coronary artery disease)   1. Acute Hypoxic Resp. Failure due to Acute Covid 19 Viral Pneumonitis during the ongoing 2020 Covid 19 Pandemic - he had severe disease but his COVID-19 pneumonia has been appropriately treated with IV steroids, remdesivir and Actemra.  His inflammatory markers have stabilized, he is currently on steroids, will finish his remdesivir course, his Covid disease seems to have gotten much better, his current hypoxia was found to be due to fluid overload..  Encouraged the patient to  sit up in chair in the daytime use I-S and flutter valve for pulmonary toiletry and then prone in bed when at night.  SpO2: 96 % O2 Flow Rate (L/min): 1 L/min  Recent Labs  Lab 04/17/19 0435  CRP 2.6*  DDIMER 3.36*  BNP 61.8  PROCALCITON <0.10     2.  Shortness of breath hypoxia due to nonspecific acute on chronic CHF  from getting excessive IV fluids.  IV fluids were stopped, he was given Lasix at [facility] 60 mg upon my request prior to transfer, he has diuresed exceptionally well and is down to 1 L nasal cannula oxygen now and symptom-free.  Outpatient echocardiogram and PCP follow-up post discharge.  No echocardiogram on file.  3.  Encephalopathy.  Due to hospital admission and some toxic encephalopathy due to COVID-19 infection.  Supportive care, Haldol as needed, minimize Ativan and narcotic use.  Family updated in detail about this problem.  And that this can get worse.  Drains as needed.  4.  Bladder outflow obstruction.  Self resolved, Flomax added, bladder scan monitoring.  5.  Mild hypotension.  P.o. status poor   6.  Story of CAD and dyslipidemia.  Confirm home medications, pharmacy doing med rec.  Resume once available.    Condition -   Guarded  Family Communication  : Son in detail on 04/17/2019  Code Status : Full code  Diet :   Diet Order            DIET SOFT Room service appropriate? Yes; Fluid consistency: Thin  Diet effective now               Disposition Plan  : To be decided  Consults  : None  Procedures  : None  PUD Prophylaxis : None  DVT Prophylaxis  :  Lovenox added  Lab Results  Component Value Date   PLT 282 04/17/2019    Inpatient Medications  Scheduled Meds: . vitamin C  500 mg Oral Daily  . dexamethasone (DECADRON) injection  6 mg Intravenous Q24H  . midodrine  5 mg Oral BID WC  . tamsulosin  0.4 mg Oral Daily  . zinc sulfate  220 mg Oral Daily   Continuous Infusions: . lactated ringers    . remdesivir 100 mg in NS 100 mL  100 mg (04/17/19 0928)   PRN Meds:.acetaminophen, chlorpheniramine-HYDROcodone, guaiFENesin-dextromethorphan, haloperidol lactate, [DISCONTINUED] ondansetron **OR** ondansetron (ZOFRAN) IV  Antibiotics  :    Anti-infectives (From admission, onward)   Start     Dose/Rate Route Frequency Ordered Stop   04/17/19 1000  remdesivir 100 mg in sodium chloride 0.9 % 100 mL IVPB  Status:  Discontinued     100 mg 200 mL/hr over 30 Minutes Intravenous Daily 04/16/19 2352 04/16/19 2357   04/17/19 1000  remdesivir 100 mg in sodium chloride 0.9 % 100 mL IVPB     100 mg 200 mL/hr over 30 Minutes Intravenous Daily 04/16/19 2358 04/20/19 0959   04/17/19 0000  remdesivir 200 mg in sodium chloride 0.9% 250 mL IVPB  Status:  Discontinued     200 mg 580 mL/hr over 30 Minutes Intravenous Once 04/16/19 2352 04/16/19 2357       Time Spent in minutes  30   Susa Raring M.D on 04/17/2019 at 11:39 AM  To page go to [email or web address] - password [facility]  [facility] -  Office  [phone]   See all Orders from today for further details    Objective:   Vitals:   04/17/19 0500 04/17/19 0720 04/17/19 0815 04/17/19 1133  BP:  (!) 90/56 100/64 93/80  Pulse: 90 78 61 84  Resp:  18 19 20   Temp:  97.8 F (36.6 C)  97.7 F (36.5 C)  TempSrc:  Axillary  Axillary  SpO2: 94% 100% 98% 96%  Weight:      Height:        Wt Readings  from Last 3 Encounters:  04/16/19 97.1 kg     Intake/Output Summary (Last 24 hours) at 04/17/2019 1139 Last data filed at 04/17/2019 1126 Gross per 24 hour  Intake 240 ml  Output 800 ml  Net -560 ml     Physical Exam  Patient in bed and confused, moving all 4 extremities to self, in no discomfort [place].AT,PERRAL Supple Neck,No JVD, No cervical lymphadenopathy appriciated.  Symmetrical Chest wall movement, Good air movement bilaterally, CTAB RRR,No Gallops,Rubs or new Murmurs, No Parasternal Heave +ve B.Sounds, Abd Soft but mild suprapubic fullness, No tenderness, No  organomegaly appriciated, No rebound - guarding or rigidity. No Cyanosis, Clubbing or edema, No new Rash or bruise      Data Review:    CBC Recent Labs  Lab 04/17/19 0435  WBC 9.5  HGB 16.2  HCT 47.3  PLT 282  MCV 94.4  MCH 32.3  MCHC 34.2  RDW 12.9    Chemistries  Recent Labs  Lab 04/17/19 0435  MG 2.0   ------------------------------------------------------------------------------------------------------------------ No results for input(s): CHOL, HDL, LDLCALC, TRIG, CHOLHDL, LDLDIRECT in the last 72 hours.  No results found for: HGBA1C ------------------------------------------------------------------------------------------------------------------ No results for input(s): TSH, T4TOTAL, T3FREE, THYROIDAB in the last 72 hours.  Invalid input(s): FREET3  Cardiac Enzymes No results for input(s): CKMB, TROPONINI, MYOGLOBIN in the last 168 hours.  Invalid input(s): CK ------------------------------------------------------------------------------------------------------------------    Component Value Date/Time   BNP 61.8 04/17/2019 0435    Micro Results No results found for this or any previous visit (from the past 240 hour(s)).  Radiology Reports DG Chest Port 1 View  Result Date: 04/17/2019 CLINICAL DATA:  Shortness of breath. EXAM: PORTABLE CHEST 1 VIEW COMPARISON:  CT angiogram chest 04/14/2019, chest radiograph 04/14/2019 FINDINGS: Heart size at the upper limits of normal, unchanged. Aortic atherosclerosis. Again demonstrated ill-defined airspace opacities within the right mid to lower lung field and left lung base. Question mild interval improvement on the left as compared to prior chest radiograph 04/14/2019. No sizable pleural effusion or evidence of pneumothorax. Overlying cardiac monitoring leads. IMPRESSION: Redemonstrated ill-defined airspace opacities within the right mid to lower lung field and left lung base. Question mild interval improvement on the left  as compared to prior chest radiograph 04/14/2019. Findings favored to reflect multifocal pneumonia. Electronically Signed   By: Kellie Simmering DO   On: 04/17/2019 07:43

## 2019-04-17 NOTE — Progress Notes (Signed)
Safety sitter at bedside throughout entire shift.

## 2019-04-17 NOTE — Progress Notes (Signed)
Pt woke up confused. Took tele, [place] and O2 sensor off. Says that he thinks he'll "just go home." Reoriented to where he is and why, still confused. Refusing to wear anything at this time

## 2019-04-17 NOTE — Evaluation (Addendum)
Clinical/Bedside Swallow Evaluation Patient Details  Name: Adam Brady MRN: [id number] Date of Birth: [birth year]  Today's Date: 04/17/2019 Time: SLP Start Time (ACUTE ONLY): 1749 SLP Stop Time (ACUTE ONLY): 1805 SLP Time Calculation (min) (ACUTE ONLY): 16 min  Past Medical History:  Past Medical History:  Diagnosis Date  . Coronary artery disease   . Dyspnea   . History of hiatal hernia   . Hypertension    Past Surgical History:  Past Surgical History:  Procedure Laterality Date  . CARDIAC CATHETERIZATION     HPI:  Adam Brady is a 86 y.o. male with medical history significant for hypertension, hyperlipidemia, coronary artery disease admitted for shortness of breath was 80% on room air initially had been on 4 L of oxygen with O2 sats of 90%.  Patient was admitted to [facility] received remdesivir Decadron and Actemra x1.  He progressively got worse requiring more oxygen needs 5 to 6 L and became more confused transferred to [facility] for further management.   Assessment / Plan / Recommendation Clinical Impression  Pt presents with normal oropharyngeal swallow with adequate mastication, brisk swallow response, no overt s/s of aspiration, no dysynchrony with swallow/respiratory patterns.  Restraints were loosened and he fed himself without difficulty despite confusion and disorientation. (restraints reapplied prior to departure; sitter present) Recommend allowing a regular diet, thin liquids; meds in water.  Allow patient to feed himself.  No dysphagia; No further SLP f/u needed - our service will sign off.  SLP Visit Diagnosis: Dysphagia, unspecified (R13.10)    Aspiration Risk  No limitations    Diet Recommendation     Medication Administration: Whole meds with liquid    Other  Recommendations Oral Care Recommendations: Oral care BID   Follow up Recommendations None        Swallow Study   General HPI: Adam Brady is a 86 y.o. male with medical history significant  for hypertension, hyperlipidemia, coronary artery disease admitted for shortness of breath was 80% on room air initially had been on 4 L of oxygen with O2 sats of 90%.  Patient was admitted to [facility] received remdesivir Decadron and Actemra x1.  He progressively got worse requiring more oxygen needs 5 to 6 L and became more confused transferred to [facility] for further management. Type of Study: Bedside Swallow Evaluation Previous Swallow Assessment: no Diet Prior to this Study: Dysphagia 3 (soft);Thin liquids Temperature Spikes Noted: No Respiratory Status: Room air History of Recent Intubation: No Behavior/Cognition: Alert;Confused Oral Cavity Assessment: Within Functional Limits Oral Care Completed by SLP: No Oral Cavity - Dentition: Adequate natural dentition Vision: Functional for self-feeding Self-Feeding Abilities: Able to feed self Patient Positioning: Upright in bed Baseline Vocal Quality: Normal Volitional Cough: Strong Volitional Swallow: Able to elicit    Oral/Motor/Sensory Function Overall Oral Motor/Sensory Function: Within functional limits   Ice Chips Ice chips: Within functional limits   Thin Liquid Thin Liquid: Within functional limits    Nectar Thick Nectar Thick Liquid: Not tested   Honey Thick Honey Thick Liquid: Not tested   Puree Puree: Within functional limits   Solid     Solid: Within functional limits      Adam Brady Adam Brady 04/17/2019,6:06 PM  Adam Brady L. Samson Frederic, MA CCC/SLP Acute Rehabilitation Services Office number [phone]

## 2019-04-17 NOTE — Progress Notes (Signed)
Initial Nutrition Assessment   INTERVENTION:  -Ensure Complete with breakfast and Magic cup with lunch and dinner daily  -Regular/Soft diet   -Assist with meals as needed given pt confused state  NUTRITION DIAGNOSIS:   Increased nutrient needs related to acute illness(COVID-19) as evidenced by estimated needs.  GOAL:  Patient will meet greater than or equal to 90% of their needs  MONITOR:  PO intake, Supplement acceptance, Labs, Skin, I & O's, Weight trends  REASON FOR ASSESSMENT:  Malnutrition Screening Tool   ASSESSMENT: Patient is an overweight 86 yo male transferred from [place]-COVID positive with history of hiatal hernia, hypertension. Patient short of breath and confused. Safety sitter at bedside.    Meal intake unknown. Ensure Complete consumed 240 ml. Provides 350 kcal, 13 gr protein.  Medications reviewed and include: Vitamin C, Zinc, Decadron, Remdesivir.   Labs reviewed:  BMP wnl except glucose 342. Mag and CBC-wnl.  NUTRITION - FOCUSED PHYSICAL EXAM: Unable to complete Nutrition-Focused physical exam at this time.  RD working remotely.  Diet Order:   Diet Order            DIET SOFT Room service appropriate? Yes; Fluid consistency: Thin  Diet effective now             EDUCATION NEEDS:  No education needs have been identified at this time Skin:  Skin Assessment: Reviewed RN Assessment  Last BM:  1/3  Height:   Ht Readings from Last 1 Encounters:  04/16/19 5\' 11"  (1.803 m)    Weight:   Wt Readings from Last 1 Encounters:  04/16/19 97.1 kg    Ideal Body Weight:  78 kg  BMI:  Body mass index is 29.86 kg/m.  Estimated Nutritional Needs:   Kcal:  06/14/19  Protein:  101-109 gr  Fluid:  > 2 liters daily   [id number] MS,RD,CSG,LDN Office: Royann Shivers Pager: [phone]

## 2019-04-17 NOTE — H&P (Signed)
History and Physical    Adam Brady JME:[id number] DOB: April 24, 1933 DOA: 04/16/2019  PCP: Patient, No Pcp Per  Patient coming from: Home  Chief Complaint: Shortness of breath  HPI: Adam Brady is a 86 y.o. male with medical history significant of hypertension, hyperlipidemia, coronary artery disease admitted for shortness of breath was 80% on room air initially had been on 4 L of oxygen with O2 sats of 90%.  Patient was admitted to [facility] received remdesivir Decadron and Actemra x1.  He progressively got worse requiring more oxygen needs 5 to 6 L and became more confused transferred to [facility] for further management.  Patient cannot provide any history at this time because he is confused and delirious.  Review of Systems: Unobtainable secondary to altered mental status  Past Medical History:  Diagnosis Date  . Coronary artery disease   . Dyspnea   . History of hiatal hernia   . Hypertension     Past Surgical History:  Procedure Laterality Date  . CARDIAC CATHETERIZATION       reports that he has quit smoking. He has never used smokeless tobacco. He reports previous alcohol use. He reports previous drug use.  Allergies  Allergen Reactions  . Nitroglycerin     Reaction: hypotension    History reviewed. No pertinent family history.  Unknown secondary to altered mental status  Prior to Admission medications   Not on File  Unknown secondary to altered mental status  Physical Exam: Vitals:   04/16/19 2315 04/16/19 2330  BP: 106/71   Pulse: (!) 113   Resp: 20   Temp: 97.9 F (36.6 C)   TempSrc: Oral   SpO2: 91%   Weight:  97.1 kg  Height:  5\' 11"  (1.803 m)      Constitutional: NAD, calm, comfortable mildly delirious Vitals:   04/16/19 2315 04/16/19 2330  BP: 106/71   Pulse: (!) 113   Resp: 20   Temp: 97.9 F (36.6 C)   TempSrc: Oral   SpO2: 91%   Weight:  97.1 kg  Height:  5\' 11"  (1.803 m)   Eyes: PERRL, lids and conjunctivae normal ENMT:  Mucous membranes are moist. Posterior pharynx clear of any exudate or lesions.Normal dentition.  Neck: normal, supple, no masses, no thyromegaly Respiratory: clear to auscultation bilaterally, no wheezing, no crackles. Normal respiratory effort. No accessory muscle use.  Cardiovascular: Regular rate and rhythm, no murmurs / rubs / gallops. No extremity edema. 2+ pedal pulses. No carotid bruits.  Abdomen: no tenderness, no masses palpated. No hepatosplenomegaly. Bowel sounds positive.  Musculoskeletal: no clubbing / cyanosis. No joint deformity upper and lower extremities. Good ROM, no contractures. Normal muscle tone.  Skin: no rashes, lesions, ulcers. No induration Neurologic: CN 2-12 grossly intact. Sensation intact, DTR normal. Strength 5/5 in all 4.  Psychiatric: Not normal judgment and insight. Alert and oriented x 0 delirious   Labs on Admission: I have personally reviewed following labs and imaging studies  CBC: No results for input(s): WBC, NEUTROABS, HGB, HCT, MCV, PLT in the last 168 hours. Basic Metabolic Panel: No results for input(s): NA, K, CL, CO2, GLUCOSE, BUN, CREATININE, CALCIUM, MG, PHOS in the last 168 hours. GFR: CrCl cannot be calculated (No successful lab value found.). Liver Function Tests: No results for input(s): AST, ALT, ALKPHOS, BILITOT, PROT, ALBUMIN in the last 168 hours. No results for input(s): LIPASE, AMYLASE in the last 168 hours. No results for input(s): AMMONIA in the last 168 hours. Coagulation Profile: No results for  input(s): INR, PROTIME in the last 168 hours. Cardiac Enzymes: No results for input(s): CKTOTAL, CKMB, CKMBINDEX, TROPONINI in the last 168 hours. BNP (last 3 results) No results for input(s): PROBNP in the last 8760 hours. HbA1C: No results for input(s): HGBA1C in the last 72 hours. CBG: No results for input(s): GLUCAP in the last 168 hours. Lipid Profile: No results for input(s): CHOL, HDL, LDLCALC, TRIG, CHOLHDL, LDLDIRECT in  the last 72 hours. Thyroid Function Tests: No results for input(s): TSH, T4TOTAL, FREET4, T3FREE, THYROIDAB in the last 72 hours. Anemia Panel: No results for input(s): VITAMINB12, FOLATE, FERRITIN, TIBC, IRON, RETICCTPCT in the last 72 hours. Urine analysis: No results found for: COLORURINE, APPEARANCEUR, LABSPEC, PHURINE, GLUCOSEU, HGBUR, BILIRUBINUR, KETONESUR, PROTEINUR, UROBILINOGEN, NITRITE, LEUKOCYTESUR Sepsis Labs: !!!!!!!!!!!!!!!!!!!!!!!!!!!!!!!!!!!!!!!!!!!! @LABRCNTIP (procalcitonin:4,lacticidven:4) )No results found for this or any previous visit (from the past 240 hour(s)).   Radiological Exams on Admission: No results found.  Chart reviewed from [place]  Assessment/Plan 86 year old male with Covid pneumonia with acute hypoxic respiratory failure Principal Problem:   Pneumonia due to COVID-19 virus-given Actemra x1.  Placed on remdesivir and Decadron.  Vitamin C and zinc.  Supplemental oxygen as tolerates.  Wean as tolerates.  Daily inflammatory markers will be monitored.  Active Problems:   Acute respiratory failure due to COVID-19 (HCC)-as above currently on 5 L.    HTN (hypertension)-stable    HLD (hyperlipidemia)-stable    CAD (coronary artery disease)-stable     DVT prophylaxis: Lovenox Code Status: Full Family Communication: None Disposition Plan: Days Consults called: None Admission status: Admission   Adam Brady A MD [facility]  If 7PM-7AM, please contact night-coverage [email or web address] Password [email or web address]  04/17/2019, 12:01 AM

## 2019-04-17 NOTE — Progress Notes (Signed)
Pt was tx from [place] for [employer]. He was getting Lovenox 50mg  BID before transferred. His Ddimer<5 and BMI ~30. We will change his lovenox to 0.5mg /kg/day.  , PharmD, BCIDP, AAHIVP, CPP Infectious Disease Pharmacist 04/17/2019 12:28 PM

## 2019-04-17 NOTE — TOC Initial Note (Signed)
Transition of Care [facility]) - Initial/Assessment Note    Patient Details  Name: Adam Brady MRN: [id number] Date of Birth: 02/08/1934  [facility]) CM/SW Contact:    Durenda Guthrie, RN Phone Number: 04/17/2019, 11:33 AM  Clinical Narrative:  86 yr old male  transferred from [facility] ED. Admitted to [facility] for further  treatment of COVID 19, has confusion and altered mental status. Patient has sitter. On Remdesivir until 04/20/19, IV decadron, oxygen at [facility]. TOC will continue to monitor.                        Patient Goals and CMS Choice        Expected Discharge Plan and Services                                                Prior Living Arrangements/Services                       Activities of Daily Living Home Assistive Devices/Equipment: None ADL Screening (condition at time of admission) Patient's cognitive ability adequate to safely complete daily activities?: Yes Is the patient deaf or have difficulty hearing?: No Does the patient have difficulty seeing, even when wearing glasses/contacts?: No Does the patient have difficulty concentrating, remembering, or making decisions?: No Patient able to express need for assistance with ADLs?: Yes Does the patient have difficulty dressing or bathing?: No Independently performs ADLs?: Yes (appropriate for developmental age) Does the patient have difficulty walking or climbing stairs?: Yes Weakness of Legs: Both Weakness of Arms/Hands: None  Permission Sought/Granted                  Emotional Assessment              Admission diagnosis:  Pneumonia due to COVID-19 virus [U07.1, J12.82] Patient Active Problem List   Diagnosis Date Noted  . Pneumonia due to COVID-19 virus 04/16/2019  . Acute respiratory failure due to COVID-19 (HCC) 04/16/2019  . HTN (hypertension) 04/16/2019  . HLD (hyperlipidemia) 04/16/2019  . CAD (coronary artery disease) 04/16/2019    PCP:  Patient, No Pcp Per Pharmacy:  No Pharmacies Listed    Social Determinants of Health (SDOH) Interventions    Readmission Risk Interventions No flowsheet data found.

## 2019-04-17 NOTE — Progress Notes (Signed)
Pt in bed now. Required reorientation from son, who agreed to pt receiving IV ativan. Tele is connected, tele on and O2 being monitored. Pt is currently on 2L [place]. Safety sitter at bedside

## 2019-04-17 NOTE — Plan of Care (Signed)
  Problem: Education: Goal: Knowledge of General Education information will improve Description: Including pain rating scale, medication(s)/side effects and non-pharmacologic comfort measures Outcome: Progressing   Problem: Health Behavior/Discharge Planning: Goal: Ability to manage health-related needs will improve Outcome: Progressing   Problem: Clinical Measurements: Goal: Diagnostic test results will improve Outcome: Progressing Goal: Respiratory complications will improve Outcome: Progressing Goal: Cardiovascular complication will be avoided Outcome: Progressing   Problem: Activity: Goal: Risk for activity intolerance will decrease Outcome: Progressing   Problem: Nutrition: Goal: Adequate nutrition will be maintained Outcome: Progressing   Problem: Education: Goal: Knowledge of risk factors and measures for prevention of condition will improve Outcome: Progressing   Problem: Coping: Goal: Psychosocial and spiritual needs will be supported Outcome: Progressing   Problem: Respiratory: Goal: Will maintain a patent airway Outcome: Progressing

## 2019-04-18 LAB — CBC WITH DIFFERENTIAL/PLATELET
Abs Immature Granulocytes: 0.06 10*3/uL (ref 0.00–0.07)
Basophils Absolute: 0 10*3/uL (ref 0.0–0.1)
Basophils Relative: 0 %
Eosinophils Absolute: 0 10*3/uL (ref 0.0–0.5)
Eosinophils Relative: 0 %
HCT: 46.5 % (ref 39.0–52.0)
Hemoglobin: 15.9 g/dL (ref 13.0–17.0)
Immature Granulocytes: 1 %
Lymphocytes Relative: 17 %
Lymphs Abs: 1.9 10*3/uL (ref 0.7–4.0)
MCH: 32.2 pg (ref 26.0–34.0)
MCHC: 34.2 g/dL (ref 30.0–36.0)
MCV: 94.1 fL (ref 80.0–100.0)
Monocytes Absolute: 1 10*3/uL (ref 0.1–1.0)
Monocytes Relative: 9 %
Neutro Abs: 7.9 10*3/uL — ABNORMAL HIGH (ref 1.7–7.7)
Neutrophils Relative %: 73 %
Platelets: 258 10*3/uL (ref 150–400)
RBC: 4.94 MIL/uL (ref 4.22–5.81)
RDW: 12.9 % (ref 11.5–15.5)
WBC: 10.8 10*3/uL — ABNORMAL HIGH (ref 4.0–10.5)
nRBC: 0 % (ref 0.0–0.2)

## 2019-04-18 LAB — COMPREHENSIVE METABOLIC PANEL
ALT: 83 U/L — ABNORMAL HIGH (ref 0–44)
AST: 84 U/L — ABNORMAL HIGH (ref 15–41)
Albumin: 2.7 g/dL — ABNORMAL LOW (ref 3.5–5.0)
Alkaline Phosphatase: 55 U/L (ref 38–126)
Anion gap: 10 (ref 5–15)
BUN: 23 mg/dL (ref 8–23)
CO2: 25 mmol/L (ref 22–32)
Calcium: 8.8 mg/dL — ABNORMAL LOW (ref 8.9–10.3)
Chloride: 103 mmol/L (ref 98–111)
Creatinine, Ser: 0.87 mg/dL (ref 0.61–1.24)
GFR calc Af Amer: >60 mL/min (ref >60)
GFR calc non Af Amer: >60 mL/min (ref >60)
Glucose, Bld: 176 mg/dL — ABNORMAL HIGH (ref 70–99)
Potassium: 3.9 mmol/L (ref 3.5–5.1)
Sodium: 138 mmol/L (ref 135–145)
Total Bilirubin: 0.7 mg/dL (ref 0.3–1.2)
Total Protein: 5.5 g/dL — ABNORMAL LOW (ref 6.5–8.1)

## 2019-04-18 LAB — PROCALCITONIN: Procalcitonin: <0.1 ng/mL

## 2019-04-18 LAB — MAGNESIUM: Magnesium: 2.1 mg/dL (ref 1.7–2.4)

## 2019-04-18 LAB — GLUCOSE, CAPILLARY
Glucose-Capillary: 196 mg/dL — ABNORMAL HIGH (ref 70–99)
Glucose-Capillary: 243 mg/dL — ABNORMAL HIGH (ref 70–99)
Glucose-Capillary: 349 mg/dL — ABNORMAL HIGH (ref 70–99)

## 2019-04-18 LAB — HEPATITIS B SURFACE ANTIBODY, QUANTITATIVE: Hep B S AB Quant (Post): <3.1 m[IU]/mL — ABNORMAL LOW (ref >9.9)

## 2019-04-18 LAB — URINE CULTURE: Culture: <10000 — AB

## 2019-04-18 LAB — BRAIN NATRIURETIC PEPTIDE: B Natriuretic Peptide: 155.9 pg/mL — ABNORMAL HIGH (ref 0.0–100.0)

## 2019-04-18 LAB — C-REACTIVE PROTEIN: CRP: 1.6 mg/dL — ABNORMAL HIGH (ref <1.0)

## 2019-04-18 LAB — D-DIMER, QUANTITATIVE: D-Dimer, Quant: 3.39 ug/mL-FEU — ABNORMAL HIGH (ref 0.00–0.50)

## 2019-04-18 MED ORDER — POLYETHYLENE GLYCOL 3350 17 G PO PACK
17.0000 g | PACK | Freq: Two times a day (BID) | ORAL | Status: DC
  Administered 2019-04-18 – 2019-04-19 (×4): 17 g via ORAL
  Filled 2019-04-18 (×5): qty 1

## 2019-04-18 MED ORDER — FUROSEMIDE 20 MG PO TABS
40.0000 mg | ORAL_TABLET | Freq: Once | ORAL | Status: AC
  Administered 2019-04-18: 40 mg via ORAL
  Filled 2019-04-18: qty 2

## 2019-04-18 MED ORDER — DEXAMETHASONE SODIUM PHOSPHATE 4 MG/ML IJ SOLN
4.0000 mg | INTRAMUSCULAR | Status: DC
  Administered 2019-04-19 – 2019-04-20 (×2): 4 mg via INTRAVENOUS
  Filled 2019-04-18 (×5): qty 1

## 2019-04-18 NOTE — Progress Notes (Signed)
Inpatient Diabetes Program Recommendations  AACE/ADA: New Consensus Statement on Inpatient Glycemic Control (2015)  Target Ranges:  Prepandial:   less than 140 mg/dL      Peak postprandial:   less than 180 mg/dL (1-2 hours)      Critically ill patients:  140 - 180 mg/dL   Lab Results  Component Value Date   GLUCAP 349 (H) 04/18/2019    Review of Glycemic Control  Diabetes history: None Outpatient Diabetes medications: N/A Current orders for Inpatient glycemic control: Novolog 0-15 units tidwc and 0-5 units QHS  Post-prandials elevated. Would benefit from meal coverage insulin. Eating 75% meals.  Inpatient Diabetes Program Recommendations:     Add Novolog 4 units tidwc for meal coverage insulin if pt eats > 50% meal. HgbA1C to assess glycemic control prior to admission.  Will follow closely.  Thank you. Ailene Ards, RD, LDN, CDE Inpatient Diabetes Coordinator [phone]

## 2019-04-18 NOTE — Plan of Care (Signed)

## 2019-04-18 NOTE — Progress Notes (Signed)
Patient restraints taken off due to patient being calm thus far this shift.  Sitter still continuous in room with patient and states no issues.  Patient has been uncooperative with some cares this shift.  Refused simvastatin and oral temperature.  Patient agreed to Lovenox after educating what it was for but yelled out when giving and was upset saying I should have told him what I was doing and that he wouldn't have let me.  Patient oriented to person and somewhat knows he is somewhere because of a virus.

## 2019-04-18 NOTE — Progress Notes (Signed)
PROGRESS NOTE                                                                                                                                                                                                             Patient Demographics:    Adam Brady, is a 86 y.o. male, DOB - 12-23-1933, VZC:[id number]  Outpatient Primary MD for the patient is Patient, No Pcp Per    LOS - 2  Admit date - 04/16/2019    CC - SOB     Brief Narrative this is a 86 year old Caucasian male who lives in [place], has past medical history of central hypertension, CAD, dyslipidemia who was diagnosed with COVID-19 pneumonia about 5 to 6 days prior to hospitalization at [facility], he developed some shortness of breath and was admitted to [facility].  He received appropriate treatment with IV remdesivir, steroids and Actemra.  Subsequently he improved but then on day 4 he started developing more shortness of breath and was sent to [facility] for further treatment   Subjective:    Adam Brady today in bed, somewhat confused but comfortable, did unreliable historian but denies any headache chest or abdominal pain or shortness of breath.   Assessment  & Plan :     1. Acute Hypoxic Resp. Failure due to Acute Covid 19 Viral Pneumonitis during the ongoing 2020 Covid 19 Pandemic - he had severe disease but his COVID-19 pneumonia has been appropriately treated with IV steroids, remdesivir and Actemra.  His inflammatory markers have stabilized, he is currently on steroids, will finish his remdesivir course, his Covid disease seems to have gotten much better, his relapse of hypoxia was due to fluid overload and CHF, he is now on room air, continue titrating oxygen, steroids down and increase activity.  Encouraged the patient to sit up in chair in the daytime use I-S and flutter valve for pulmonary toiletry and then prone in bed when at  night.  SpO2: 98 % O2 Flow Rate (L/min): 1 L/min  Recent Labs  Lab 04/17/19 0435 04/18/19 0505  CRP 2.6* 1.6*  DDIMER 3.36* 3.39*  BNP 61.8 155.9*  PROCALCITON <0.10  --      2.  Shortness of breath hypoxia due to nonspecific acute on chronic CHF from getting excessive IV fluids.  IV fluids were stopped, much improved after  Lasix, has diuresed well and now down to room air at rest and 1 L on movement, continue supportive care and close monitoring.  Outpatient echocardiogram and PCP follow-up post discharge.  No echocardiogram on file.  3.  Encephalopathy.  Due to hospital admission and some toxic encephalopathy due to COVID-19 infection.  Supportive care, Haldol as needed, minimize Ativan and narcotic use.  Family updated in detail about this problem.  He is much improved on 04/18/2019.  4.  Bladder outflow obstruction.  Self resolved, Flomax added, bladder scan monitoring.  5.  Mild hypotension.  P.o. status poor   6.  Story of CAD and dyslipidemia.  Confirm home medications, pharmacy doing med rec.  Resume once available.    Condition -   Guarded  Family Communication  : Son in detail on 04/17/2019, 04/18/19  Code Status : Full code  Diet :   Diet Order            Diet regular Room service appropriate? Yes with Assist; Fluid consistency: Thin  Diet effective now               Disposition Plan  : HHPT  Consults  : None  Procedures  : None  PUD Prophylaxis : None  DVT Prophylaxis  :  Lovenox added  Lab Results  Component Value Date   PLT 258 04/18/2019    Inpatient Medications  Scheduled Meds: . vitamin C  500 mg Oral Daily  . aspirin  81 mg Oral Daily  . [START ON 04/19/2019] dexamethasone (DECADRON) injection  4 mg Intravenous Q24H  . enoxaparin (LOVENOX) injection  50 mg Subcutaneous Q24H  . insulin aspart  0-15 Units Subcutaneous TID WC  . insulin aspart  0-5 Units Subcutaneous QHS  . midodrine  5 mg Oral BID WC  . simvastatin  20 mg Oral QHS  .  tamsulosin  0.4 mg Oral Daily  . zinc sulfate  220 mg Oral Daily   Continuous Infusions: . remdesivir 100 mg in NS 100 mL 100 mg (04/18/19 0911)   PRN Meds:.acetaminophen, chlorpheniramine-HYDROcodone, fluticasone, guaiFENesin-dextromethorphan, haloperidol lactate, [DISCONTINUED] ondansetron **OR** ondansetron (ZOFRAN) IV  Antibiotics  :    Anti-infectives (From admission, onward)   Start     Dose/Rate Route Frequency Ordered Stop   04/17/19 1000  remdesivir 100 mg in sodium chloride 0.9 % 100 mL IVPB  Status:  Discontinued     100 mg 200 mL/hr over 30 Minutes Intravenous Daily 04/16/19 2352 04/16/19 2357   04/17/19 1000  remdesivir 100 mg in sodium chloride 0.9 % 100 mL IVPB     100 mg 200 mL/hr over 30 Minutes Intravenous Daily 04/16/19 2358 04/20/19 0959   04/17/19 0000  remdesivir 200 mg in sodium chloride 0.9% 250 mL IVPB  Status:  Discontinued     200 mg 580 mL/hr over 30 Minutes Intravenous Once 04/16/19 2352 04/16/19 2357       Time Spent in minutes  30   Susa Raring M.D on 04/18/2019 at 9:20 AM  To page go to [email or web address] - password [email or web address]  [facility] -  Office  [phone]   See all Orders from today for further details    Objective:   Vitals:   04/17/19 2000 04/18/19 0000 04/18/19 0400 04/18/19 0711  BP: (!) 97/56 109/66 101/70 114/71  Pulse: 65 65 81 84  Resp: 18 18 (!) 22 18  Temp: (!) 97.5 F (36.4 C) 97.9 F (36.6 C) 98.1 F (36.7 C) 98 F (36.7  C)  TempSrc: Oral Oral Oral Oral  SpO2: 95% 96% 98% 98%  Weight:      Height:        Wt Readings from Last 3 Encounters:  04/16/19 97.1 kg     Intake/Output Summary (Last 24 hours) at 04/18/2019 0920 Last data filed at 04/18/2019 0855 Gross per 24 hour  Intake 1227.29 ml  Output 850 ml  Net 377.29 ml     Physical Exam  Patient in bed and alert, in no discomfort Aten.AT,PERRAL Supple Neck,No JVD, No cervical lymphadenopathy appriciated.  Symmetrical Chest wall movement, Good air  movement bilaterally, CTAB RRR,No Gallops, Rubs or new Murmurs, No Parasternal Heave +ve B.Sounds, Abd Soft, No tenderness, No organomegaly appriciated, No rebound - guarding or rigidity. No Cyanosis, Clubbing or edema, No new Rash or bruise    Data Review:    CBC Recent Labs  Lab 04/17/19 0435 04/18/19 0505  WBC 9.5 10.8*  HGB 16.2 15.9  HCT 47.3 46.5  PLT 282 258  MCV 94.4 94.1  MCH 32.3 32.2  MCHC 34.2 34.2  RDW 12.9 12.9  LYMPHSABS  --  1.9  MONOABS  --  1.0  EOSABS  --  0.0  BASOSABS  --  0.0    Chemistries  Recent Labs  Lab 04/17/19 0435 04/17/19 1217 04/18/19 0505  NA  --  136 138  K  --  3.8 3.9  CL  --  101 103  CO2  --  26 25  GLUCOSE  --  342* 176*  BUN  --  23 23  CREATININE  --  0.95 0.87  CALCIUM  --  9.1 8.8*  MG 2.0  --  2.1  AST  --   --  84*  ALT  --   --  83*  ALKPHOS  --   --  55  BILITOT  --   --  0.7   ------------------------------------------------------------------------------------------------------------------ No results for input(s): CHOL, HDL, LDLCALC, TRIG, CHOLHDL, LDLDIRECT in the last 72 hours.  No results found for: HGBA1C ------------------------------------------------------------------------------------------------------------------ No results for input(s): TSH, T4TOTAL, T3FREE, THYROIDAB in the last 72 hours.  Invalid input(s): FREET3  Cardiac Enzymes No results for input(s): CKMB, TROPONINI, MYOGLOBIN in the last 168 hours.  Invalid input(s): CK ------------------------------------------------------------------------------------------------------------------    Component Value Date/Time   BNP 155.9 (H) 04/18/2019 0505    Micro Results Recent Results (from the past 240 hour(s))  Culture, Urine     Status: None (Preliminary result)   Collection Time: 04/17/19 11:10 AM   Specimen: Urine, Random  Result Value Ref Range Status   Specimen Description   Final    URINE, RANDOM Performed at [facility]  Lab, [address], [place], [place] [address]    Special Requests   Final    NONE Performed at [facility], [address] [place], [place] [address]    Culture PENDING  Incomplete   Report Status PENDING  Incomplete    Radiology Reports DG Chest Port 1 View  Result Date: 04/17/2019 CLINICAL DATA:  Shortness of breath. EXAM: PORTABLE CHEST 1 VIEW COMPARISON:  CT angiogram chest 04/14/2019, chest radiograph 04/14/2019 FINDINGS: Heart size at the upper limits of normal, unchanged. Aortic atherosclerosis. Again demonstrated ill-defined airspace opacities within the right mid to lower lung field and left lung base. Question mild interval improvement on the left as compared to prior chest radiograph 04/14/2019. No sizable pleural effusion or evidence of pneumothorax. Overlying cardiac monitoring leads. IMPRESSION: Redemonstrated ill-defined  airspace opacities within the right mid to lower lung field and left lung base. Question mild interval improvement on the left as compared to prior chest radiograph 04/14/2019. Findings favored to reflect multifocal pneumonia. Electronically Signed   By: Jackey Loge DO   On: 04/17/2019 07:43

## 2019-04-18 NOTE — Evaluation (Signed)
Occupational Therapy Evaluation Patient Details Name: Adam Brady MRN: [id number] DOB: 10-23-1933 Today's Date: 04/18/2019    History of Present Illness Adam Brady is a 86 y.o. male with medical history significant of hypertension, hyperlipidemia, coronary artery disease admitted for shortness of breath was 80% on room air initially had been on 4 L of oxygen with O2 sats of 90%.  Patient was admitted to [facility] received remdesivir Decadron and Actemra x1.  He progressively got worse requiring more oxygen needs 5 to 6 L and became more confused transferred to [facility] for further management.   Clinical Impression   PTA, pt was living with his wife and was performing ADLs, IADLs, and driving. Pt currently requiring Min Guard-Min A for ADLs and functional mobility. Pt presenting with decreased cognition, balance, and activity tolerance. Pt able to maintain SpO2 >85% on RA with purse lip breathing and rest breaks. Pt would benefit from further acute OT to facilitate safe dc. Recommend dc to home with HHOT for further OT to optimize safety, independence with ADLs, and return to PLOF.      Follow Up Recommendations  Home health OT;Supervision/Assistance - 24 hour    Equipment Recommendations  3 in 1 bedside commode(as shower seat)    Recommendations for Other Services PT consult     Precautions / Restrictions Precautions Precautions: Fall Precaution Comments: Has been confused and agitated, was not so much with PT this visit Restrictions Weight Bearing Restrictions: No      Mobility Bed Mobility Overal bed mobility: Needs Assistance             General bed mobility comments: Pt in recliner upon arrival  Transfers Overall transfer level: Needs assistance Equipment used: None Transfers: Sit to/from Stand Sit to Stand: Min assist         General transfer comment: Min A for balance     Balance Overall balance assessment: Needs assistance Sitting-balance  support: Feet supported;No upper extremity supported Sitting balance-Leahy Scale: Fair     Standing balance support: No upper extremity supported;During functional activity Standing balance-Leahy Scale: Fair                             ADL either performed or assessed with clinical judgement   ADL Overall ADL's : Needs assistance/impaired Eating/Feeding: Set up;Sitting   Grooming: Oral care;Wash/dry hands;Wash/dry face;Brushing hair;Min guard;Standing(shaving) Grooming Details (indicate cue type and reason): Min Guard A for safety while at sink. Pt performing shaving and brushing his hair, required a breif seated rest break, and then performed oral care.  Upper Body Bathing: Min guard;Sitting   Lower Body Bathing: Minimal assistance;Sit to/from stand   Upper Body Dressing : Min guard;Sitting   Lower Body Dressing: Minimal assistance;Sit to/from stand   Toilet Transfer: Minimal assistance;Ambulation(simulated to recliner)           Functional mobility during ADLs: Minimal assistance General ADL Comments: Pt demonstrating decreased actiivty tolerance as seen with fatigue. However, very motivated to participate in therapy. Min A for balance     Vision         Perception     Praxis      Pertinent Vitals/Pain Pain Assessment: No/denies pain     Hand Dominance Right   Extremity/Trunk Assessment Upper Extremity Assessment Upper Extremity Assessment: Generalized weakness   Lower Extremity Assessment Lower Extremity Assessment: Defer to PT evaluation   Cervical / Trunk Assessment Cervical / Trunk Assessment: Normal   Communication  Communication Communication: No difficulties   Cognition Arousal/Alertness: Awake/alert Behavior During Therapy: WFL for tasks assessed/performed Overall Cognitive Status: Impaired/Different from baseline Area of Impairment: Orientation;Safety/judgement;Attention;Problem solving                 Orientation Level:  Time(Able to state it is 2021. Slightly off on day of week) Current Attention Level: Sustained     Safety/Judgement: Decreased awareness of deficits;Decreased awareness of safety Awareness: Emergent   General Comments: Pt presenting with decreased processing as seen during ADLs when pt was required to locate neccessary items. Pt requiring increased time to visual scan.   General Comments  SpO2 maintaining >85% on RA throughout. Pt able to perform purse lip breathing to elevate back to 90s.    Exercises     Shoulder Instructions      Home Living Family/patient expects to be discharged to:: Private residence Living Arrangements: Spouse/significant other Available Help at Discharge: Family;Available 24 hours/day Type of Home: House Home Access: Stairs to enter [employer] of Steps: 3 steps front; 5-6 side entrance; 4-5 back. Usually uses the side Entrance Stairs-Rails: Can reach both(at side entrace) Home Layout: One level     Bathroom Shower/Tub: Walk-in shower;Tub/shower unit(prefers the small walk in shower)   Bathroom Toilet: Standard     Home Equipment: Cane - single point;Hand held shower head          Prior Functioning/Environment Level of Independence: Independent        Comments: drives, recently remarried (three weeks)        OT Problem List: Decreased strength;Decreased range of motion;Decreased activity tolerance;Impaired balance (sitting and/or standing);Decreased safety awareness;Decreased knowledge of precautions;Decreased cognition;Decreased knowledge of use of DME or AE;Pain      OT Treatment/Interventions: Self-care/ADL training;Therapeutic exercise;Energy conservation;DME and/or AE instruction;Therapeutic activities;Patient/family education    OT Goals(Current goals can be found in the care plan section) Acute Rehab OT Goals Patient Stated Goal: Go home soon OT Goal Formulation: With patient Time For Goal Achievement:  05/02/19 Potential to Achieve Goals: Good  OT Frequency: Min 2X/week   Barriers to D/C:            Co-evaluation              AM-PAC OT "6 Clicks" Daily Activity     Outcome Measure Help from another person eating meals?: None Help from another person taking care of personal grooming?: A Little Help from another person toileting, which includes using toliet, bedpan, or urinal?: A Little Help from another person bathing (including washing, rinsing, drying)?: A Little Help from another person to put on and taking off regular upper body clothing?: A Little Help from another person to put on and taking off regular lower body clothing?: A Little 6 Click Score: 19   End of Session Nurse Communication: Mobility status  Activity Tolerance: Patient tolerated treatment well Patient left: in chair;with call bell/phone within reach;with chair alarm set  OT Visit Diagnosis: Unsteadiness on feet (R26.81);Other abnormalities of gait and mobility (R26.89);Muscle weakness (generalized) (M62.81);Other symptoms and signs involving cognitive function                Time: 2703-5009 OT Time Calculation (min): 36 min Charges:  OT General Charges $OT Visit: 1 Visit OT Evaluation $OT Eval Moderate Complexity: 1 Mod OT Treatments $Self Care/Home Management : 8-22 mins  Matthew Pais MSOT, OTR/L Acute Rehab Pager: [phone] Office: [phone]  Theodoro Grist Caela Huot 04/18/2019, 1:01 PM

## 2019-04-19 LAB — CBC WITH DIFFERENTIAL/PLATELET
Abs Immature Granulocytes: 0.09 10*3/uL — ABNORMAL HIGH (ref 0.00–0.07)
Basophils Absolute: 0 10*3/uL (ref 0.0–0.1)
Basophils Relative: 0 %
Eosinophils Absolute: 0.1 10*3/uL (ref 0.0–0.5)
Eosinophils Relative: 1 %
HCT: 49.4 % (ref 39.0–52.0)
Hemoglobin: 16.8 g/dL (ref 13.0–17.0)
Immature Granulocytes: 1 %
Lymphocytes Relative: 21 %
Lymphs Abs: 2.2 10*3/uL (ref 0.7–4.0)
MCH: 32.4 pg (ref 26.0–34.0)
MCHC: 34 g/dL (ref 30.0–36.0)
MCV: 95.4 fL (ref 80.0–100.0)
Monocytes Absolute: 1.1 10*3/uL — ABNORMAL HIGH (ref 0.1–1.0)
Monocytes Relative: 10 %
Neutro Abs: 7 10*3/uL (ref 1.7–7.7)
Neutrophils Relative %: 67 %
Platelets: 262 10*3/uL (ref 150–400)
RBC: 5.18 MIL/uL (ref 4.22–5.81)
RDW: 12.7 % (ref 11.5–15.5)
WBC: 10.5 10*3/uL (ref 4.0–10.5)
nRBC: 0 % (ref 0.0–0.2)

## 2019-04-19 LAB — COMPREHENSIVE METABOLIC PANEL
ALT: 98 U/L — ABNORMAL HIGH (ref 0–44)
AST: 50 U/L — ABNORMAL HIGH (ref 15–41)
Albumin: 3 g/dL — ABNORMAL LOW (ref 3.5–5.0)
Alkaline Phosphatase: 70 U/L (ref 38–126)
Anion gap: 12 (ref 5–15)
BUN: 23 mg/dL (ref 8–23)
CO2: 24 mmol/L (ref 22–32)
Calcium: 8.5 mg/dL — ABNORMAL LOW (ref 8.9–10.3)
Chloride: 101 mmol/L (ref 98–111)
Creatinine, Ser: 0.94 mg/dL (ref 0.61–1.24)
GFR calc Af Amer: >60 mL/min (ref >60)
GFR calc non Af Amer: >60 mL/min (ref >60)
Glucose, Bld: 258 mg/dL — ABNORMAL HIGH (ref 70–99)
Potassium: 4.1 mmol/L (ref 3.5–5.1)
Sodium: 137 mmol/L (ref 135–145)
Total Bilirubin: 0.8 mg/dL (ref 0.3–1.2)
Total Protein: 5.8 g/dL — ABNORMAL LOW (ref 6.5–8.1)

## 2019-04-19 LAB — D-DIMER, QUANTITATIVE: D-Dimer, Quant: 3.49 ug/mL-FEU — ABNORMAL HIGH (ref 0.00–0.50)

## 2019-04-19 LAB — C-REACTIVE PROTEIN: CRP: 1.5 mg/dL — ABNORMAL HIGH (ref <1.0)

## 2019-04-19 LAB — HEMOGLOBIN A1C
Hgb A1c MFr Bld: 7.9 % — ABNORMAL HIGH (ref 4.8–5.6)
Mean Plasma Glucose: 180 mg/dL

## 2019-04-19 LAB — GLUCOSE, CAPILLARY
Glucose-Capillary: 356 mg/dL — ABNORMAL HIGH (ref 70–99)
Glucose-Capillary: 168 mg/dL — ABNORMAL HIGH (ref 70–99)
Glucose-Capillary: 333 mg/dL — ABNORMAL HIGH (ref 70–99)
Glucose-Capillary: 237 mg/dL — ABNORMAL HIGH (ref 70–99)

## 2019-04-19 LAB — BRAIN NATRIURETIC PEPTIDE: B Natriuretic Peptide: 56.4 pg/mL (ref 0.0–100.0)

## 2019-04-19 LAB — PROCALCITONIN: Procalcitonin: <0.1 ng/mL

## 2019-04-19 LAB — MAGNESIUM: Magnesium: 2.3 mg/dL (ref 1.7–2.4)

## 2019-04-19 MED ORDER — ALUM & MAG HYDROXIDE-SIMETH 200-200-20 MG/5ML PO SUSP
30.0000 mL | Freq: Four times a day (QID) | ORAL | Status: DC | PRN
  Administered 2019-04-19: 30 mL via ORAL
  Filled 2019-04-19 (×2): qty 30

## 2019-04-19 MED ORDER — ENOXAPARIN SODIUM 40 MG/0.4ML ~~LOC~~ SOLN
40.0000 mg | SUBCUTANEOUS | Status: DC
  Administered 2019-04-19: 40 mg via SUBCUTANEOUS
  Filled 2019-04-19: qty 0.4

## 2019-04-19 NOTE — Progress Notes (Signed)
Patient transferred to med/surg 3rd floor room 319.  Report given to Braulio Conte, RN.  Care turned over at this time.

## 2019-04-19 NOTE — Progress Notes (Signed)
Inpatient Diabetes Program Recommendations  AACE/ADA: New Consensus Statement on Inpatient Glycemic Control (2015)  Target Ranges:  Prepandial:   less than 140 mg/dL      Peak postprandial:   less than 180 mg/dL (1-2 hours)      Critically ill patients:  140 - 180 mg/dL   Lab Results  Component Value Date   GLUCAP 333 (H) 04/19/2019   HGBA1C 7.9 (H) 04/18/2019    Review of Glycemic Control  Diabetes history: None Outpatient Diabetes medications: N/A Current orders for Inpatient glycemic control: Novolog 0-15 units tidwc and 0-5 units QHS  On Decadron 4 mg Q24H HgbA1C - 7.9% CBGs 168, 258, 333. Eating 60-100% of meals  Inpatient Diabetes Program Recommendations:     Add Novolog 3 units tidwc for meal coverage insulin if pt eats > 50% meal.  Continue to follow.   Thank you. Ailene Ards, RD, LDN, CDE Inpatient Diabetes Coordinator [phone]

## 2019-04-19 NOTE — Progress Notes (Signed)
PROGRESS NOTE                                                                                                                                                                                                             Patient Demographics:    Adam Brady, is a 86 y.o. male, DOB - [birth year], VOJ:[id number]  Outpatient Primary MD for the patient is Patient, No Pcp Per    LOS - 3  Admit date - 04/16/2019    CC - SOB     Brief Narrative this is a 86 year old Caucasian male who lives in [facility], has past medical history of central hypertension, CAD, dyslipidemia who was diagnosed with COVID-19 pneumonia about 5 to 6 days prior to hospitalization at [facility], he developed some shortness of breath and was admitted to [facility].  He received appropriate treatment with IV remdesivir, steroids and Actemra.  Subsequently he improved but then on day 4 he started developing more shortness of breath and was sent to [facility] for further treatment   Subjective:   Patient in bed, appears comfortable, denies any headache, no fever, no chest pain or pressure, no shortness of breath , no abdominal pain. No focal weakness.   Assessment  & Plan :     1. Acute Hypoxic Resp. Failure due to Acute Covid 19 Viral Pneumonitis during the ongoing 2020 Covid 19 Pandemic - he had severe disease but his COVID-19 pneumonia has been appropriately treated with IV steroids, remdesivir and Actemra.  His inflammatory markers have stabilized, he is currently on steroids, will finish his remdesivir course, his Covid disease seems to have gotten much better, his relapse of hypoxia was due to fluid overload and CHF, he is now on room air to 1lit PRN, continue titrating oxygen, steroids down and increase activity.  Encouraged the patient to sit up in chair in the daytime use I-S and flutter valve for pulmonary toiletry and then prone in bed when at  night.  SpO2: 92 % O2 Flow Rate (L/min): 1 L/min  Recent Labs  Lab 04/17/19 0435 04/18/19 0505  CRP 2.6* 1.6*  DDIMER 3.36* 3.39*  BNP 61.8 155.9*  PROCALCITON <0.10 <0.10     2.  Shortness of breath hypoxia due to nonspecific acute on chronic CHF from getting excessive IV fluids.  IV fluids were stopped, much  improved after Lasix, has diuresed well and now down to room air at rest and 1 L on movement, continue supportive care and close monitoring.  Outpatient echocardiogram and PCP follow-up post discharge.  No echocardiogram on file.  3.  Encephalopathy.  Due to hospital admission and some toxic encephalopathy due to COVID-19 infection.  Supportive care, Haldol as needed, minimize Ativan and narcotic use.  Family updated in detail about this problem.  He is much improved now and likely close to his baseline.  4.  Bladder outflow obstruction.  Self resolved, Flomax added, bladder scan monitoring q shift.  5.  Mild hypotension.  P.o. status poor   6.  HX of CAD and dyslipidemia.  Statin resumed.    Condition -   Guarded  Family Communication  : Son in detail on 04/17/2019, 04/18/19  Code Status : Full code  Diet :   Diet Order            Diet regular Room service appropriate? Yes with Assist; Fluid consistency: Thin  Diet effective now               Disposition Plan  : HHPT  Consults  : None  Procedures  : None  PUD Prophylaxis : None  DVT Prophylaxis  :  Lovenox added  Lab Results  Component Value Date   PLT 258 04/18/2019    Inpatient Medications  Scheduled Meds: . vitamin C  500 mg Oral Daily  . aspirin  81 mg Oral Daily  . dexamethasone (DECADRON) injection  4 mg Intravenous Q24H  . enoxaparin (LOVENOX) injection  40 mg Subcutaneous Q24H  . insulin aspart  0-15 Units Subcutaneous TID WC  . insulin aspart  0-5 Units Subcutaneous QHS  . polyethylene glycol  17 g Oral BID  . simvastatin  20 mg Oral QHS  . tamsulosin  0.4 mg Oral Daily  . zinc  sulfate  220 mg Oral Daily   Continuous Infusions: . remdesivir 100 mg in NS 100 mL Stopped (04/18/19 0941)   PRN Meds:.acetaminophen, chlorpheniramine-HYDROcodone, fluticasone, guaiFENesin-dextromethorphan, haloperidol lactate, [DISCONTINUED] ondansetron **OR** ondansetron (ZOFRAN) IV  Antibiotics  :    Anti-infectives (From admission, onward)   Start     Dose/Rate Route Frequency Ordered Stop   04/17/19 1000  remdesivir 100 mg in sodium chloride 0.9 % 100 mL IVPB  Status:  Discontinued     100 mg 200 mL/hr over 30 Minutes Intravenous Daily 04/16/19 2352 04/16/19 2357   04/17/19 1000  remdesivir 100 mg in sodium chloride 0.9 % 100 mL IVPB     100 mg 200 mL/hr over 30 Minutes Intravenous Daily 04/16/19 2358 04/20/19 0959   04/17/19 0000  remdesivir 200 mg in sodium chloride 0.9% 250 mL IVPB  Status:  Discontinued     200 mg 580 mL/hr over 30 Minutes Intravenous Once 04/16/19 2352 04/16/19 2357       Time Spent in minutes  30   Lala Lund M.D on 04/19/2019 at 9:28 AM  To page go to [email or web address] - password [place]  [facility] -  Office  [phone]   See all Orders from today for further details    Objective:   Vitals:   04/18/19 1627 04/18/19 2330 04/19/19 0400 04/19/19 0800  BP: (!) 144/85 140/89 125/76 (!) 125/93  Pulse: 96 99 99 90  Resp: 19 20 (!) 22 20  Temp: 97.9 F (36.6 C) 97.7 F (36.5 C) 98 F (36.7 C) 97.6 F (36.4 C)  TempSrc: Oral  Oral Oral Axillary  SpO2:  93% 96% 92%  Weight:      Height:        Wt Readings from Last 3 Encounters:  04/16/19 97.1 kg     Intake/Output Summary (Last 24 hours) at 04/19/2019 0928 Last data filed at 04/19/2019 0700 Gross per 24 hour  Intake 120 ml  Output 800 ml  Net -680 ml     Physical Exam  Awake Alert,  No new F.N deficits,   [place].AT,PERRAL Supple Neck,No JVD, No cervical lymphadenopathy appriciated.  Symmetrical Chest wall movement, Good air movement bilaterally, CTAB RRR,No Gallops, Rubs or  new Murmurs, No Parasternal Heave +ve B.Sounds, Abd Soft, No tenderness, No organomegaly appriciated, No rebound - guarding or rigidity. No Cyanosis, Clubbing or edema, No new Rash or bruise    Data Review:    CBC Recent Labs  Lab 04/17/19 0435 04/18/19 0505  WBC 9.5 10.8*  HGB 16.2 15.9  HCT 47.3 46.5  PLT 282 258  MCV 94.4 94.1  MCH 32.3 32.2  MCHC 34.2 34.2  RDW 12.9 12.9  LYMPHSABS  --  1.9  MONOABS  --  1.0  EOSABS  --  0.0  BASOSABS  --  0.0    Chemistries  Recent Labs  Lab 04/17/19 0435 04/17/19 1217 04/18/19 0505  NA  --  136 138  K  --  3.8 3.9  CL  --  101 103  CO2  --  26 25  GLUCOSE  --  342* 176*  BUN  --  23 23  CREATININE  --  0.95 0.87  CALCIUM  --  9.1 8.8*  MG 2.0  --  2.1  AST  --   --  84*  ALT  --   --  83*  ALKPHOS  --   --  55  BILITOT  --   --  0.7   ------------------------------------------------------------------------------------------------------------------ No results for input(s): CHOL, HDL, LDLCALC, TRIG, CHOLHDL, LDLDIRECT in the last 72 hours.  Lab Results  Component Value Date   HGBA1C 7.9 (H) 04/18/2019   ------------------------------------------------------------------------------------------------------------------ No results for input(s): TSH, T4TOTAL, T3FREE, THYROIDAB in the last 72 hours.  Invalid input(s): FREET3  Cardiac Enzymes No results for input(s): CKMB, TROPONINI, MYOGLOBIN in the last 168 hours.  Invalid input(s): CK ------------------------------------------------------------------------------------------------------------------    Component Value Date/Time   BNP 155.9 (H) 04/18/2019 0505    Micro Results Recent Results (from the past 240 hour(s))  Culture, Urine     Status: Abnormal   Collection Time: 04/17/19 11:10 AM   Specimen: Urine, Random  Result Value Ref Range Status   Specimen Description   Final    URINE, RANDOM Performed at [facility], [address] [place],  [place] [address]    Special Requests   Final    NONE Performed at [facility], [address] [place], [place] [address]    Culture (A)  Final    <10,000 COLONIES/mL INSIGNIFICANT GROWTH Performed at [facility], [address], [place], [place] [address]    Report Status 04/18/2019 FINAL  Final    Radiology Reports DG Chest Port 1 View  Result Date: 04/17/2019 CLINICAL DATA:  Shortness of breath. EXAM: PORTABLE CHEST 1 VIEW COMPARISON:  CT angiogram chest 04/14/2019, chest radiograph 04/14/2019 FINDINGS: Heart size at the upper limits of normal, unchanged. Aortic atherosclerosis. Again demonstrated ill-defined airspace opacities within the right mid to lower lung field and left lung base. Question mild interval  improvement on the left as compared to prior chest radiograph 04/14/2019. No sizable pleural effusion or evidence of pneumothorax. Overlying cardiac monitoring leads. IMPRESSION: Redemonstrated ill-defined airspace opacities within the right mid to lower lung field and left lung base. Question mild interval improvement on the left as compared to prior chest radiograph 04/14/2019. Findings favored to reflect multifocal pneumonia. Electronically Signed   By: Jackey Loge DO   On: 04/17/2019 07:43

## 2019-04-19 NOTE — Plan of Care (Signed)
  Problem: Nutrition: Goal: Adequate nutrition will be maintained Outcome: Progressing   Problem: Coping: Goal: Level of anxiety will decrease Outcome: Progressing   Problem: Safety: Goal: Ability to remain free from injury will improve Outcome: Progressing   Problem: Skin Integrity: Goal: Risk for impaired skin integrity will decrease Outcome: Progressing   

## 2019-04-20 LAB — COMPREHENSIVE METABOLIC PANEL
ALT: 76 U/L — ABNORMAL HIGH (ref 0–44)
AST: 33 U/L (ref 15–41)
Albumin: 3.1 g/dL — ABNORMAL LOW (ref 3.5–5.0)
Alkaline Phosphatase: 60 U/L (ref 38–126)
Anion gap: 9 (ref 5–15)
BUN: 22 mg/dL (ref 8–23)
CO2: 30 mmol/L (ref 22–32)
Calcium: 8.6 mg/dL — ABNORMAL LOW (ref 8.9–10.3)
Chloride: 98 mmol/L (ref 98–111)
Creatinine, Ser: 1.01 mg/dL (ref 0.61–1.24)
GFR calc Af Amer: >60 mL/min (ref >60)
GFR calc non Af Amer: >60 mL/min (ref >60)
Glucose, Bld: 152 mg/dL — ABNORMAL HIGH (ref 70–99)
Potassium: 4.3 mmol/L (ref 3.5–5.1)
Sodium: 137 mmol/L (ref 135–145)
Total Bilirubin: 1.1 mg/dL (ref 0.3–1.2)
Total Protein: 5.6 g/dL — ABNORMAL LOW (ref 6.5–8.1)

## 2019-04-20 LAB — CBC WITH DIFFERENTIAL/PLATELET
Abs Immature Granulocytes: 0.16 10*3/uL — ABNORMAL HIGH (ref 0.00–0.07)
Basophils Absolute: 0.1 10*3/uL (ref 0.0–0.1)
Basophils Relative: 1 %
Eosinophils Absolute: 0.1 10*3/uL (ref 0.0–0.5)
Eosinophils Relative: 1 %
HCT: 48.5 % (ref 39.0–52.0)
Hemoglobin: 16.4 g/dL (ref 13.0–17.0)
Immature Granulocytes: 1 %
Lymphocytes Relative: 18 %
Lymphs Abs: 2.2 10*3/uL (ref 0.7–4.0)
MCH: 32.4 pg (ref 26.0–34.0)
MCHC: 33.8 g/dL (ref 30.0–36.0)
MCV: 95.8 fL (ref 80.0–100.0)
Monocytes Absolute: 0.9 10*3/uL (ref 0.1–1.0)
Monocytes Relative: 8 %
Neutro Abs: 8.4 10*3/uL — ABNORMAL HIGH (ref 1.7–7.7)
Neutrophils Relative %: 71 %
Platelets: 277 10*3/uL (ref 150–400)
RBC: 5.06 MIL/uL (ref 4.22–5.81)
RDW: 12.8 % (ref 11.5–15.5)
WBC: 11.7 10*3/uL — ABNORMAL HIGH (ref 4.0–10.5)
nRBC: 0 % (ref 0.0–0.2)

## 2019-04-20 LAB — D-DIMER, QUANTITATIVE: D-Dimer, Quant: 3.13 ug/mL-FEU — ABNORMAL HIGH (ref 0.00–0.50)

## 2019-04-20 LAB — C-REACTIVE PROTEIN: CRP: 1.1 mg/dL — ABNORMAL HIGH (ref <1.0)

## 2019-04-20 LAB — BRAIN NATRIURETIC PEPTIDE: B Natriuretic Peptide: 33 pg/mL (ref 0.0–100.0)

## 2019-04-20 LAB — GLUCOSE, CAPILLARY
Glucose-Capillary: 141 mg/dL — ABNORMAL HIGH (ref 70–99)
Glucose-Capillary: 284 mg/dL — ABNORMAL HIGH (ref 70–99)

## 2019-04-20 LAB — MAGNESIUM: Magnesium: 2.4 mg/dL (ref 1.7–2.4)

## 2019-04-20 MED ORDER — METOPROLOL TARTRATE 50 MG PO TABS: 50.0000 mg | ORAL_TABLET | Freq: Two times a day (BID) | ORAL | 0 refills | Status: DC

## 2019-04-20 MED ORDER — FUROSEMIDE 20 MG PO TABS
40.0000 mg | ORAL_TABLET | Freq: Once | ORAL | Status: AC
  Administered 2019-04-20: 40 mg via ORAL
  Filled 2019-04-20: qty 2

## 2019-04-20 MED ORDER — METOPROLOL TARTRATE 25 MG PO TABS
25.0000 mg | ORAL_TABLET | Freq: Two times a day (BID) | ORAL | Status: DC
  Filled 2019-04-20: qty 1

## 2019-04-20 NOTE — TOC Transition Note (Signed)
Transition of Care [facility]) - CM/SW Discharge Note   Patient Details  Name: Adam Brady MRN: [id number] Date of Birth: 1933-10-15  Transition of Care [facility]) CM/SW Contact:  Colleen Can MSN, RN, NCM-BC, ACM-RN [phone] Phone Number: 04/20/2019, 11:56 AM   Clinical Narrative:    CM following for dispositional needs. CM spoke to the patients son/HCPOA, Adam Brady, to discuss the POC. is a 86 y.o. male with medical history significant of hypertension, hyperlipidemia, coronary artery disease admitted for shortness of breath; COVID+. PCP/Demo verified. PT/OT eval completed with HH/DME recommended. CMS HH compare and DME preference list provided to son with no preference. HH referral given to Lorenza Chick RN [facility] liaison); DME referral given to Durward Fortes [facility] liaison); AVS updated. Son is requesting PTAR transportation, with the patients son home and available to assist post-discharge; spouse is also COVID +. PTAR will be arranged as requested. DME will be delivered to the patients home. No further needs from CM.    Final next level of care: Home w Home Health Services Barriers to Discharge: No Barriers Identified   Patient Goals and CMS Choice   CMS Medicare.gov Compare Post Acute Care list provided to:: Patient Represenative (must comment)(Adam Brady (son/HCPOA)) Choice offered to / list presented to : Adult Children(Adam Brady (son/HCPOA))   Discharge Plan and Services                DME Arranged: Bedside commode, Walker rolling DME Agency: [facility] Date DME Agency Contacted: 04/20/19 Time DME Agency Contacted: 1154 Representative spoke with at DME Agency: Durward Fortes (liaison) HH Arranged: PT, OT, Social Work [employer] Agency: [facility] Date [facility] Agency Contacted: 04/20/19 Time HH Agency Contacted: 1155 Representative spoke with at [facility] Agency: Lorenza Chick RN (liaison)  Social Determinants of Health (SDOH) Interventions     Readmission Risk  Interventions No flowsheet data found.

## 2019-04-20 NOTE — Care Management Important Message (Signed)
Important Message  Patient Details  Name: Adam Brady MRN: [id number] Date of Birth: Sep 01, 1933   Medicare Important Message Given:  Yes - Important Message mailed due to current National Emergency  Verbal consent obtained due to current National Emergency  Relationship to patient: Child Contact Name: Pranit Owensby Call Date: 04/20/19  Time: 1357 Phone: [phone] Outcome: Spoke with contact Important Message mailed to: Patient address on file    Orson Aloe 04/20/2019, 1:58 PM

## 2019-04-20 NOTE — Discharge Instructions (Signed)
Follow with Primary MD in 7 days   Get CBC, CMP, 2 view Chest X ray -  checked next visit within 1 week by Primary MD    Activity: As tolerated with Full fall precautions use walker/cane & assistance as needed  Disposition Home    Diet: Heart Healthy    Special Instructions: If you have smoked or chewed Tobacco  in the last 2 yrs please stop smoking, stop any regular Alcohol  and or any Recreational drug use.  On your next visit with your primary care physician please Get Medicines reviewed and adjusted.  Please request your Prim.MD to go over all Hospital Tests and Procedure/Radiological results at the follow up, please get all Hospital records sent to your Prim MD by signing hospital release before you go home.  If you experience worsening of your admission symptoms, develop shortness of breath, life threatening emergency, suicidal or homicidal thoughts you must seek medical attention immediately by calling 911 or calling your MD immediately  if symptoms less severe.  You Must read complete instructions/literature along with all the possible adverse reactions/side effects for all the Medicines you take and that have been prescribed to you. Take any new Medicines after you have completely understood and accpet all the possible adverse reactions/side effects.         Person Under Monitoring Name: Adam Brady  Location: [address] [place] [address]   Infection Prevention Recommendations for Individuals Confirmed to have, or Being Evaluated for, 2019 Novel Coronavirus (COVID-19) Infection Who Receive Care at Home  Individuals who are confirmed to have, or are being evaluated for, COVID-19 should follow the prevention steps below until a healthcare provider or local or state health department says they can return to normal activities.  Stay home except to get medical care You should restrict activities outside your home, except for getting medical care. Do not  go to work, school, or public areas, and do not use public transportation or taxis.  Call ahead before visiting your doctor Before your medical appointment, call the healthcare provider and tell them that you have, or are being evaluated for, COVID-19 infection. This will help the healthcare providers office take steps to keep other people from getting infected. Ask your healthcare provider to call the local or state health department.  Monitor your symptoms Seek prompt medical attention if your illness is worsening (e.g., difficulty breathing). Before going to your medical appointment, call the healthcare provider and tell them that you have, or are being evaluated for, COVID-19 infection. Ask your healthcare provider to call the local or state health department.  Wear a facemask You should wear a facemask that covers your nose and mouth when you are in the same room with other people and when you visit a healthcare provider. People who live with or visit you should also wear a facemask while they are in the same room with you.  Separate yourself from other people in your home As much as possible, you should stay in a different room from other people in your home. Also, you should use a separate bathroom, if available.  Avoid sharing household items You should not share dishes, drinking glasses, cups, eating utensils, towels, bedding, or other items with other people in your home. After using these items, you should wash them thoroughly with soap and water.  Cover your coughs and sneezes Cover your mouth and nose with a tissue when you cough or sneeze, or you can cough or  sneeze into your sleeve. Throw used tissues in a lined trash can, and immediately wash your hands with soap and water for at least 20 seconds or use an alcohol-based hand rub.  Wash your [facility] your hands often and thoroughly with soap and water for at least 20 seconds. You can use an alcohol-based  hand sanitizer if soap and water are not available and if your hands are not visibly dirty. Avoid touching your eyes, nose, and mouth with unwashed hands.   Prevention Steps for Caregivers and Household Members of Individuals Confirmed to have, or Being Evaluated for, COVID-19 Infection Being Cared for in the Home  If you live with, or provide care at home for, a person confirmed to have, or being evaluated for, COVID-19 infection please follow these guidelines to prevent infection:  Follow healthcare providers instructions Make sure that you understand and can help the patient follow any healthcare provider instructions for all care.  Provide for the patients basic needs You should help the patient with basic needs in the home and provide support for getting groceries, prescriptions, and other personal needs.  Monitor the patients symptoms If they are getting sicker, call his or her medical provider and tell them that the patient has, or is being evaluated for, COVID-19 infection. This will help the healthcare providers office take steps to keep other people from getting infected. Ask the healthcare provider to call the local or state health department.  Limit the number of people who have contact with the patient  If possible, have only one caregiver for the patient.  Other household members should stay in another home or place of residence. If this is not possible, they should stay  in another room, or be separated from the patient as much as possible. Use a separate bathroom, if available.  Restrict visitors who do not have an essential need to be in the home.  Keep older adults, very young children, and other sick people away from the patient Keep older adults, very young children, and those who have compromised immune systems or chronic health conditions away from the patient. This includes people with chronic heart, lung, or kidney conditions, diabetes, and  cancer.  Ensure good ventilation Make sure that shared spaces in the home have good air flow, such as from an air conditioner or an opened window, weather permitting.  Wash your hands often  Wash your hands often and thoroughly with soap and water for at least 20 seconds. You can use an alcohol based hand sanitizer if soap and water are not available and if your hands are not visibly dirty.  Avoid touching your eyes, nose, and mouth with unwashed hands.  Use disposable paper towels to dry your hands. If not available, use dedicated cloth towels and replace them when they become wet.  Wear a facemask and gloves  Wear a disposable facemask at all times in the room and gloves when you touch or have contact with the patients blood, body fluids, and/or secretions or excretions, such as sweat, saliva, sputum, nasal mucus, vomit, urine, or feces.  Ensure the mask fits over your nose and mouth tightly, and do not touch it during use.  Throw out disposable facemasks and gloves after using them. Do not reuse.  Wash your hands immediately after removing your facemask and gloves.  If your personal clothing becomes contaminated, carefully remove clothing and launder. Wash your hands after handling contaminated clothing.  Place all used disposable facemasks, gloves, and other waste  in a lined container before disposing them with other household waste.  Remove gloves and wash your hands immediately after handling these items.  Do not share dishes, glasses, or other household items with the patient  Avoid sharing household items. You should not share dishes, drinking glasses, cups, eating utensils, towels, bedding, or other items with a patient who is confirmed to have, or being evaluated for, COVID-19 infection.  After the person uses these items, you should wash them thoroughly with soap and water.  Wash laundry thoroughly  Immediately remove and wash clothes or bedding that have blood, body  fluids, and/or secretions or excretions, such as sweat, saliva, sputum, nasal mucus, vomit, urine, or feces, on them.  Wear gloves when handling laundry from the patient.  Read and follow directions on labels of laundry or clothing items and detergent. In general, wash and dry with the warmest temperatures recommended on the label.  Clean all areas the individual has used often  Clean all touchable surfaces, such as counters, tabletops, doorknobs, bathroom fixtures, toilets, phones, keyboards, tablets, and bedside tables, every day. Also, clean any surfaces that may have blood, body fluids, and/or secretions or excretions on them.  Wear gloves when cleaning surfaces the patient has come in contact with.  Use a diluted bleach solution (e.g., dilute bleach with 1 part bleach and 10 parts water) or a household disinfectant with a label that says EPA-registered for coronaviruses. To make a bleach solution at home, add 1 tablespoon of bleach to 1 quart (4 cups) of water. For a larger supply, add  cup of bleach to 1 gallon (16 cups) of water.  Read labels of cleaning products and follow recommendations provided on product labels. Labels contain instructions for safe and effective use of the cleaning product including precautions you should take when applying the product, such as wearing gloves or eye protection and making sure you have good ventilation during use of the product.  Remove gloves and wash hands immediately after cleaning.  Monitor yourself for signs and symptoms of illness Caregivers and household members are considered close contacts, should monitor their health, and will be asked to limit movement outside of the home to the extent possible. Follow the monitoring steps for close contacts listed on the symptom monitoring form.   ? If you have additional questions, contact your local health department or call the epidemiologist on call at [phone] (available 24/7). ? This  guidance is subject to change. For the most up-to-date guidance from [facility], please refer to their website: [email or web address]

## 2019-04-20 NOTE — Progress Notes (Signed)
Assisted patient with changing clothes and going to the bathroom before DC.  Packed all belongings.

## 2019-04-20 NOTE — Discharge Summary (Signed)
Adam Brady VXU:[id number] DOB: [birth year] DOA: 04/16/2019  PCP: Patient, No Pcp Per  Admit date: 04/16/2019  Discharge date: 04/20/2019  Admitted From: Home   Disposition:  Home   Recommendations for Outpatient Follow-up:   Follow up with PCP in 1-2 weeks  PCP Please obtain BMP/CBC, 2 view CXR in 1week,  (see Discharge instructions)   PCP Please follow up on the following pending results:    Home Health: RN,PT   Equipment/Devices: 5 wheeled rolling walker Consultations: None  Discharge Condition: Stable    CODE STATUS: Full    Diet Recommendation: Heart Healthy   Diet Order            Diet - low sodium heart healthy        Diet regular Room service appropriate? Yes with Assist; Fluid consistency: Thin  Diet effective now               CC - SOB   Brief history of present illness from the day of admission and additional interim summary    86 year old Caucasian male who lives in [facility], has past medical history of central hypertension, CAD, dyslipidemia who was diagnosed with COVID-19 pneumonia about 5 to 6 days prior to hospitalization at [facility], he developed some shortness of breath and was admitted to [facility].  He received appropriate treatment with IV remdesivir, steroids and Actemra.  Subsequently he improved but then on day 4 he started developing more shortness of breath and was sent to [facility] for further treatment                                                                 Hospital Course     1. Acute Hypoxic Resp. Failure due to Acute Covid 19 Viral Pneumonitis during the ongoing 2020 Covid 19 Pandemic - he had severe disease but his COVID-19 pneumonia has been appropriately treated with IV steroids, remdesivir and Actemra.  His inflammatory markers have stabilized,  now symptom-free on room air, he has some fluid overload as well which he responded very well to with Lasix, he is now symptom-free and on room air for 24 hours, eager to go home will be discharged home on home medications with outpatient PCP follow-up.    Recent Labs  Lab 04/17/19 0435 04/18/19 0505 04/19/19 1150 04/20/19 0310  CRP 2.6* 1.6* 1.5* 1.1*  DDIMER 3.36* 3.39* 3.49* 3.13*  BNP 61.8 155.9* 56.4  --   PROCALCITON <0.10 <0.10 <0.10  --     Hepatic Function Latest Ref Rng & Units 04/20/2019 04/19/2019 04/18/2019  Total Protein 6.5 - 8.1 g/dL 5.6(L) 5.8(L) 5.5(L)  Albumin 3.5 - 5.0 g/dL 3.1(L) 3.0(L) 2.7(L)  AST 15 - 41 U/L 33 50(H) 84(H)  ALT 0 - 44 U/L 76(H) 98(H) 83(H)  Alk Phosphatase  38 - 126 U/L 60 70 55  Total Bilirubin 0.3 - 1.2 mg/dL 1.1 0.8 0.7     2.  Shortness of breath hypoxia due to nonspecific acute on chronic CHF from getting excessive IV fluids.  IV fluids were stopped, much improved after Lasix, has diuresed well and now symptom-free on room air.  Outpatient echocardiogram and PCP follow-up post discharge.  No echocardiogram on file.  3.  Encephalopathy.  Due to hospital admission and some toxic encephalopathy due to COVID-19 infection.    Completely resolved.  4.  Bladder outflow obstruction.  Self resolved, Flomax added, bladder scan monitoring q shift.  5.  .  Hypertension.  Placed on low-dose beta-blocker, ARB held for now.   6.  HX of CAD and dyslipidemia.  Statin resumed.   Low-dose allopurinol.  Discharge diagnosis     Principal Problem:   Pneumonia due to COVID-19 virus Active Problems:   Acute respiratory failure due to COVID-19 (HCC)   HTN (hypertension)   HLD (hyperlipidemia)   CAD (coronary artery disease)    Discharge instructions    Discharge Instructions    Diet - low sodium heart healthy   Complete by: As directed    Discharge instructions   Complete by: As directed    Follow with Primary MD in 7 days   Get CBC, CMP, 2  view Chest X ray -  checked next visit within 1 week by Primary MD    Activity: As tolerated with Full fall precautions use walker/cane & assistance as needed  Disposition Home    Diet: Heart Healthy    Special Instructions: If you have smoked or chewed Tobacco  in the last 2 yrs please stop smoking, stop any regular Alcohol  and or any Recreational drug use.  On your next visit with your primary care physician please Get Medicines reviewed and adjusted.  Please request your Prim.MD to go over all Hospital Tests and Procedure/Radiological results at the follow up, please get all Hospital records sent to your Prim MD by signing hospital release before you go home.  If you experience worsening of your admission symptoms, develop shortness of breath, life threatening emergency, suicidal or homicidal thoughts you must seek medical attention immediately by calling 911 or calling your MD immediately  if symptoms less severe.  You Must read complete instructions/literature along with all the possible adverse reactions/side effects for all the Medicines you take and that have been prescribed to you. Take any new Medicines after you have completely understood and accpet all the possible adverse reactions/side effects.   Increase activity slowly   Complete by: As directed    MyChart COVID-19 home monitoring program   Complete by: Apr 20, 2019    Is the patient willing to use the [employer] for home monitoring?: Yes   Temperature monitoring   Complete by: Apr 20, 2019    After how many days would you like to receive a notification of this patient's flowsheet entries?: 1      Discharge Medications   Allergies as of 04/20/2019      Reactions   Nitroglycerin    Reaction: hypotension      Medication List    STOP taking these medications   losartan 100 MG tablet Commonly known as: COZAAR     TAKE these medications   BAYER BACK & BODY PO Take 500 mg by mouth daily as needed  (pain).   co-enzyme Q-10 50 MG capsule Take 200 mg by  mouth daily.   fluticasone 50 MCG/ACT nasal spray Commonly known as: FLONASE Place 2 sprays into both nostrils daily as needed for allergies.   Garlic 9038 MG Caps Take 1,000 mg by mouth daily.   meclizine 25 MG tablet Commonly known as: ANTIVERT Take 25 mg by mouth 2 (two) times daily as needed for dizziness.   metoprolol tartrate 50 MG tablet Commonly known as: LOPRESSOR Take 1 tablet (50 mg total) by mouth 2 (two) times daily.   simvastatin 20 MG tablet Commonly known as: ZOCOR Take 20 mg by mouth at bedtime.   tamsulosin 0.4 MG Caps capsule Commonly known as: FLOMAX Take 0.4 mg by mouth daily.   traMADol 50 MG tablet Commonly known as: ULTRAM Take 50 mg by mouth 3 (three) times daily as needed for pain.            Durable Medical Equipment  (From admission, onward)         Start     Ordered   04/20/19 0717  DME Walker rolling 5 wheels  Once    Comments: 5 wheel  Question Answer Comment  Walker: With 5 Inch Wheels   Patient needs a walker to treat with the following condition Weakness      04/20/19 0716          Follow-up Information    PCP. Schedule an appointment as soon as possible for a visit in 1 week(s).           Major procedures and Radiology Reports - PLEASE review detailed and final reports thoroughly  -         DG Chest Port 1 View  Result Date: 04/17/2019 CLINICAL DATA:  Shortness of breath. EXAM: PORTABLE CHEST 1 VIEW COMPARISON:  CT angiogram chest 04/14/2019, chest radiograph 04/14/2019 FINDINGS: Heart size at the upper limits of normal, unchanged. Aortic atherosclerosis. Again demonstrated ill-defined airspace opacities within the right mid to lower lung field and left lung base. Question mild interval improvement on the left as compared to prior chest radiograph 04/14/2019. No sizable pleural effusion or evidence of pneumothorax. Overlying cardiac monitoring leads. IMPRESSION:  Redemonstrated ill-defined airspace opacities within the right mid to lower lung field and left lung base. Question mild interval improvement on the left as compared to prior chest radiograph 04/14/2019. Findings favored to reflect multifocal pneumonia. Electronically Signed   By: Kellie Simmering DO   On: 04/17/2019 07:43    Micro Results     Recent Results (from the past 240 hour(s))  Culture, Urine     Status: Abnormal   Collection Time: 04/17/19 11:10 AM   Specimen: Urine, Random  Result Value Ref Range Status   Specimen Description   Final    URINE, RANDOM Performed at [facility], [address] [place], [place] [address]    Special Requests   Final    NONE Performed at [facility], [place] [address] [place], [place] [address]    Culture (A)  Final    <10,000 COLONIES/mL INSIGNIFICANT GROWTH Performed at [facility] [address] [place],  [address]    Report Status 04/18/2019 FINAL  Final    Today   Subjective    Adam Brady today has no headache,no chest abdominal pain,no new weakness tingling or numbness, feels much better wants to go home today.     Objective   Blood pressure 123/84, pulse 90, temperature 98.2 F (36.8 C), temperature source Oral, resp. rate 16,  height '5\' 11"'  (1.803 m), weight 97.1 kg, SpO2 92 %.   Intake/Output Summary (Last 24 hours) at 04/20/2019 0953 Last data filed at 04/20/2019 0848 Gross per 24 hour  Intake 1240 ml  Output 200 ml  Net 1040 ml    Exam Awake Alert,  No new F.N deficits, Normal affect [place].AT,PERRAL Supple Neck,No JVD, No cervical lymphadenopathy appriciated.  Symmetrical Chest wall movement, Good air movement bilaterally, CTAB RRR,No Gallops,Rubs or new Murmurs, No Parasternal Heave +ve B.Sounds, Abd Soft, Non tender, No organomegaly appriciated, No rebound -guarding or rigidity. No Cyanosis, Clubbing or edema, No new Rash or bruise   Data Review   CBC w Diff:  Lab Results   Component Value Date   WBC 11.7 (H) 04/20/2019   HGB 16.4 04/20/2019   HCT 48.5 04/20/2019   PLT 277 04/20/2019   LYMPHOPCT 18 04/20/2019   MONOPCT 8 04/20/2019   EOSPCT 1 04/20/2019   BASOPCT 1 04/20/2019    CMP:  Lab Results  Component Value Date   NA 137 04/20/2019   K 4.3 04/20/2019   CL 98 04/20/2019   CO2 30 04/20/2019   BUN 22 04/20/2019   CREATININE 1.01 04/20/2019   PROT 5.6 (L) 04/20/2019   ALBUMIN 3.1 (L) 04/20/2019   BILITOT 1.1 04/20/2019   ALKPHOS 60 04/20/2019   AST 33 04/20/2019   ALT 76 (H) 04/20/2019  .   Total Time in preparing paper work, data evaluation and todays exam - 71 minutes  Lala Lund M.D on 04/20/2019 at 9:53 AM  [facility]   Office  [phone]

## 2019-04-24 DIAGNOSIS — J9601 Acute respiratory failure with hypoxia: Secondary | ICD-10-CM | POA: Diagnosis not present

## 2019-04-24 DIAGNOSIS — Z9181 History of falling: Secondary | ICD-10-CM | POA: Diagnosis not present

## 2019-04-24 DIAGNOSIS — I10 Essential (primary) hypertension: Secondary | ICD-10-CM | POA: Diagnosis not present

## 2019-04-24 DIAGNOSIS — U071 COVID-19: Secondary | ICD-10-CM | POA: Diagnosis not present

## 2019-04-24 DIAGNOSIS — I2510 Atherosclerotic heart disease of native coronary artery without angina pectoris: Secondary | ICD-10-CM | POA: Diagnosis not present

## 2019-04-24 DIAGNOSIS — J1282 Pneumonia due to coronavirus disease 2019: Secondary | ICD-10-CM | POA: Diagnosis not present

## 2019-04-24 DIAGNOSIS — J1289 Other viral pneumonia: Secondary | ICD-10-CM | POA: Diagnosis not present

## 2019-04-24 DIAGNOSIS — E785 Hyperlipidemia, unspecified: Secondary | ICD-10-CM | POA: Diagnosis not present

## 2019-05-03 DIAGNOSIS — U071 COVID-19: Secondary | ICD-10-CM | POA: Diagnosis not present

## 2019-05-04 DIAGNOSIS — J9601 Acute respiratory failure with hypoxia: Secondary | ICD-10-CM | POA: Diagnosis not present

## 2019-05-04 DIAGNOSIS — E785 Hyperlipidemia, unspecified: Secondary | ICD-10-CM | POA: Diagnosis not present

## 2019-05-04 DIAGNOSIS — Z9181 History of falling: Secondary | ICD-10-CM | POA: Diagnosis not present

## 2019-05-04 DIAGNOSIS — I2510 Atherosclerotic heart disease of native coronary artery without angina pectoris: Secondary | ICD-10-CM | POA: Diagnosis not present

## 2019-05-04 DIAGNOSIS — J1282 Pneumonia due to coronavirus disease 2019: Secondary | ICD-10-CM | POA: Diagnosis not present

## 2019-05-04 DIAGNOSIS — I10 Essential (primary) hypertension: Secondary | ICD-10-CM | POA: Diagnosis not present

## 2019-05-04 DIAGNOSIS — U071 COVID-19: Secondary | ICD-10-CM | POA: Diagnosis not present

## 2019-05-08 ENCOUNTER — Other Ambulatory Visit: Payer: Self-pay

## 2019-05-11 DIAGNOSIS — J1282 Pneumonia due to coronavirus disease 2019: Secondary | ICD-10-CM | POA: Diagnosis not present

## 2019-05-11 DIAGNOSIS — I2510 Atherosclerotic heart disease of native coronary artery without angina pectoris: Secondary | ICD-10-CM | POA: Diagnosis not present

## 2019-05-11 DIAGNOSIS — Z9181 History of falling: Secondary | ICD-10-CM | POA: Diagnosis not present

## 2019-05-11 DIAGNOSIS — J9601 Acute respiratory failure with hypoxia: Secondary | ICD-10-CM | POA: Diagnosis not present

## 2019-05-11 DIAGNOSIS — E785 Hyperlipidemia, unspecified: Secondary | ICD-10-CM | POA: Diagnosis not present

## 2019-05-11 DIAGNOSIS — U071 COVID-19: Secondary | ICD-10-CM | POA: Diagnosis not present

## 2019-05-11 DIAGNOSIS — I10 Essential (primary) hypertension: Secondary | ICD-10-CM | POA: Diagnosis not present

## 2019-05-16 DIAGNOSIS — J9601 Acute respiratory failure with hypoxia: Secondary | ICD-10-CM | POA: Diagnosis not present

## 2019-05-16 DIAGNOSIS — I2510 Atherosclerotic heart disease of native coronary artery without angina pectoris: Secondary | ICD-10-CM | POA: Diagnosis not present

## 2019-05-16 DIAGNOSIS — E785 Hyperlipidemia, unspecified: Secondary | ICD-10-CM | POA: Diagnosis not present

## 2019-05-16 DIAGNOSIS — U071 COVID-19: Secondary | ICD-10-CM | POA: Diagnosis not present

## 2019-05-16 DIAGNOSIS — I10 Essential (primary) hypertension: Secondary | ICD-10-CM | POA: Diagnosis not present

## 2019-05-16 DIAGNOSIS — Z9181 History of falling: Secondary | ICD-10-CM | POA: Diagnosis not present

## 2019-05-16 DIAGNOSIS — J1282 Pneumonia due to coronavirus disease 2019: Secondary | ICD-10-CM | POA: Diagnosis not present

## 2019-05-23 DIAGNOSIS — U071 COVID-19: Secondary | ICD-10-CM | POA: Diagnosis not present

## 2019-05-24 DIAGNOSIS — J1289 Other viral pneumonia: Secondary | ICD-10-CM | POA: Diagnosis not present

## 2019-06-06 DIAGNOSIS — Z6830 Body mass index (BMI) 30.0-30.9, adult: Secondary | ICD-10-CM | POA: Diagnosis not present

## 2019-06-06 DIAGNOSIS — K5909 Other constipation: Secondary | ICD-10-CM | POA: Diagnosis not present

## 2019-06-06 DIAGNOSIS — E669 Obesity, unspecified: Secondary | ICD-10-CM | POA: Diagnosis not present

## 2019-06-06 DIAGNOSIS — R040 Epistaxis: Secondary | ICD-10-CM | POA: Diagnosis not present

## 2019-06-06 DIAGNOSIS — I10 Essential (primary) hypertension: Secondary | ICD-10-CM | POA: Diagnosis not present

## 2019-06-06 DIAGNOSIS — E119 Type 2 diabetes mellitus without complications: Secondary | ICD-10-CM | POA: Diagnosis not present

## 2019-06-06 DIAGNOSIS — Z79899 Other long term (current) drug therapy: Secondary | ICD-10-CM | POA: Diagnosis not present

## 2019-06-10 DIAGNOSIS — Z23 Encounter for immunization: Secondary | ICD-10-CM | POA: Diagnosis not present

## 2019-08-01 DIAGNOSIS — E669 Obesity, unspecified: Secondary | ICD-10-CM | POA: Diagnosis not present

## 2019-08-01 DIAGNOSIS — J309 Allergic rhinitis, unspecified: Secondary | ICD-10-CM | POA: Diagnosis not present

## 2019-08-01 DIAGNOSIS — R0982 Postnasal drip: Secondary | ICD-10-CM | POA: Diagnosis not present

## 2019-08-01 DIAGNOSIS — Z6830 Body mass index (BMI) 30.0-30.9, adult: Secondary | ICD-10-CM | POA: Diagnosis not present

## 2019-08-01 DIAGNOSIS — R42 Dizziness and giddiness: Secondary | ICD-10-CM | POA: Diagnosis not present

## 2019-08-01 DIAGNOSIS — K5909 Other constipation: Secondary | ICD-10-CM | POA: Diagnosis not present

## 2019-08-01 DIAGNOSIS — H9193 Unspecified hearing loss, bilateral: Secondary | ICD-10-CM | POA: Diagnosis not present

## 2019-08-01 DIAGNOSIS — M542 Cervicalgia: Secondary | ICD-10-CM | POA: Diagnosis not present

## 2019-08-08 DIAGNOSIS — I7090 Unspecified atherosclerosis: Secondary | ICD-10-CM | POA: Diagnosis not present

## 2019-08-08 DIAGNOSIS — I6523 Occlusion and stenosis of bilateral carotid arteries: Secondary | ICD-10-CM | POA: Diagnosis not present

## 2019-09-04 DIAGNOSIS — Z6830 Body mass index (BMI) 30.0-30.9, adult: Secondary | ICD-10-CM | POA: Diagnosis not present

## 2019-09-04 DIAGNOSIS — I10 Essential (primary) hypertension: Secondary | ICD-10-CM | POA: Diagnosis not present

## 2019-09-04 DIAGNOSIS — Z79899 Other long term (current) drug therapy: Secondary | ICD-10-CM | POA: Diagnosis not present

## 2019-09-04 DIAGNOSIS — E669 Obesity, unspecified: Secondary | ICD-10-CM | POA: Diagnosis not present

## 2019-09-04 DIAGNOSIS — E7800 Pure hypercholesterolemia, unspecified: Secondary | ICD-10-CM | POA: Diagnosis not present

## 2019-09-04 DIAGNOSIS — E8881 Metabolic syndrome: Secondary | ICD-10-CM | POA: Diagnosis not present

## 2019-11-06 DIAGNOSIS — Z6830 Body mass index (BMI) 30.0-30.9, adult: Secondary | ICD-10-CM | POA: Diagnosis not present

## 2019-11-06 DIAGNOSIS — Z23 Encounter for immunization: Secondary | ICD-10-CM | POA: Diagnosis not present

## 2019-11-06 DIAGNOSIS — E669 Obesity, unspecified: Secondary | ICD-10-CM | POA: Diagnosis not present

## 2019-11-06 DIAGNOSIS — E7800 Pure hypercholesterolemia, unspecified: Secondary | ICD-10-CM | POA: Diagnosis not present

## 2019-11-06 DIAGNOSIS — I10 Essential (primary) hypertension: Secondary | ICD-10-CM | POA: Diagnosis not present

## 2019-11-06 DIAGNOSIS — E785 Hyperlipidemia, unspecified: Secondary | ICD-10-CM | POA: Diagnosis not present

## 2019-11-06 DIAGNOSIS — Z0000 Encounter for general adult medical examination without abnormal findings: Secondary | ICD-10-CM | POA: Diagnosis not present

## 2019-11-06 DIAGNOSIS — E1169 Type 2 diabetes mellitus with other specified complication: Secondary | ICD-10-CM | POA: Diagnosis not present

## 2019-11-06 DIAGNOSIS — Z125 Encounter for screening for malignant neoplasm of prostate: Secondary | ICD-10-CM | POA: Diagnosis not present

## 2019-11-15 DIAGNOSIS — Z6830 Body mass index (BMI) 30.0-30.9, adult: Secondary | ICD-10-CM | POA: Diagnosis not present

## 2019-11-15 DIAGNOSIS — H6983 Other specified disorders of Eustachian tube, bilateral: Secondary | ICD-10-CM | POA: Diagnosis not present

## 2019-12-11 DIAGNOSIS — E1169 Type 2 diabetes mellitus with other specified complication: Secondary | ICD-10-CM | POA: Diagnosis not present

## 2019-12-11 DIAGNOSIS — Z79899 Other long term (current) drug therapy: Secondary | ICD-10-CM | POA: Diagnosis not present

## 2019-12-11 DIAGNOSIS — Z125 Encounter for screening for malignant neoplasm of prostate: Secondary | ICD-10-CM | POA: Diagnosis not present

## 2020-03-05 DIAGNOSIS — K5909 Other constipation: Secondary | ICD-10-CM | POA: Diagnosis not present

## 2020-03-05 DIAGNOSIS — I10 Essential (primary) hypertension: Secondary | ICD-10-CM | POA: Diagnosis not present

## 2020-03-05 DIAGNOSIS — Z23 Encounter for immunization: Secondary | ICD-10-CM | POA: Diagnosis not present

## 2020-03-05 DIAGNOSIS — Z1331 Encounter for screening for depression: Secondary | ICD-10-CM | POA: Diagnosis not present

## 2020-03-05 DIAGNOSIS — R351 Nocturia: Secondary | ICD-10-CM | POA: Diagnosis not present

## 2020-03-05 DIAGNOSIS — Z9181 History of falling: Secondary | ICD-10-CM | POA: Diagnosis not present

## 2020-03-05 DIAGNOSIS — N401 Enlarged prostate with lower urinary tract symptoms: Secondary | ICD-10-CM | POA: Diagnosis not present

## 2020-03-05 DIAGNOSIS — E1169 Type 2 diabetes mellitus with other specified complication: Secondary | ICD-10-CM | POA: Diagnosis not present

## 2020-03-05 DIAGNOSIS — E785 Hyperlipidemia, unspecified: Secondary | ICD-10-CM | POA: Diagnosis not present

## 2020-04-19 DIAGNOSIS — Z23 Encounter for immunization: Secondary | ICD-10-CM | POA: Diagnosis not present

## 2020-05-02 DIAGNOSIS — Z01818 Encounter for other preprocedural examination: Secondary | ICD-10-CM | POA: Diagnosis not present

## 2020-05-02 DIAGNOSIS — H2512 Age-related nuclear cataract, left eye: Secondary | ICD-10-CM | POA: Diagnosis not present

## 2020-06-06 DIAGNOSIS — Z79899 Other long term (current) drug therapy: Secondary | ICD-10-CM | POA: Diagnosis not present

## 2020-06-06 DIAGNOSIS — Z6830 Body mass index (BMI) 30.0-30.9, adult: Secondary | ICD-10-CM | POA: Diagnosis not present

## 2020-06-06 DIAGNOSIS — E1169 Type 2 diabetes mellitus with other specified complication: Secondary | ICD-10-CM | POA: Diagnosis not present

## 2020-06-06 DIAGNOSIS — I10 Essential (primary) hypertension: Secondary | ICD-10-CM | POA: Diagnosis not present

## 2020-06-06 DIAGNOSIS — R55 Syncope and collapse: Secondary | ICD-10-CM | POA: Diagnosis not present

## 2020-06-06 DIAGNOSIS — E785 Hyperlipidemia, unspecified: Secondary | ICD-10-CM | POA: Diagnosis not present

## 2020-07-05 DIAGNOSIS — E669 Obesity, unspecified: Secondary | ICD-10-CM | POA: Diagnosis not present

## 2020-07-05 DIAGNOSIS — R55 Syncope and collapse: Secondary | ICD-10-CM | POA: Diagnosis not present

## 2020-07-05 DIAGNOSIS — I10 Essential (primary) hypertension: Secondary | ICD-10-CM | POA: Diagnosis not present

## 2020-07-05 DIAGNOSIS — Z6830 Body mass index (BMI) 30.0-30.9, adult: Secondary | ICD-10-CM | POA: Diagnosis not present

## 2020-07-10 DIAGNOSIS — E7800 Pure hypercholesterolemia, unspecified: Secondary | ICD-10-CM | POA: Diagnosis not present

## 2020-07-10 DIAGNOSIS — J309 Allergic rhinitis, unspecified: Secondary | ICD-10-CM | POA: Diagnosis not present

## 2020-07-10 DIAGNOSIS — I10 Essential (primary) hypertension: Secondary | ICD-10-CM | POA: Diagnosis not present

## 2020-09-14 DIAGNOSIS — K5900 Constipation, unspecified: Secondary | ICD-10-CM | POA: Diagnosis not present

## 2020-10-03 IMAGING — DX DG CHEST 1V PORT
1 series · 1 of 1 positions shown · non-contrast
Comparison: CT angiogram chest 04/14/2019, chest radiograph
04/14/2019

CLINICAL DATA: Shortness of breath.

EXAM:
PORTABLE CHEST 1 VIEW

[chest]
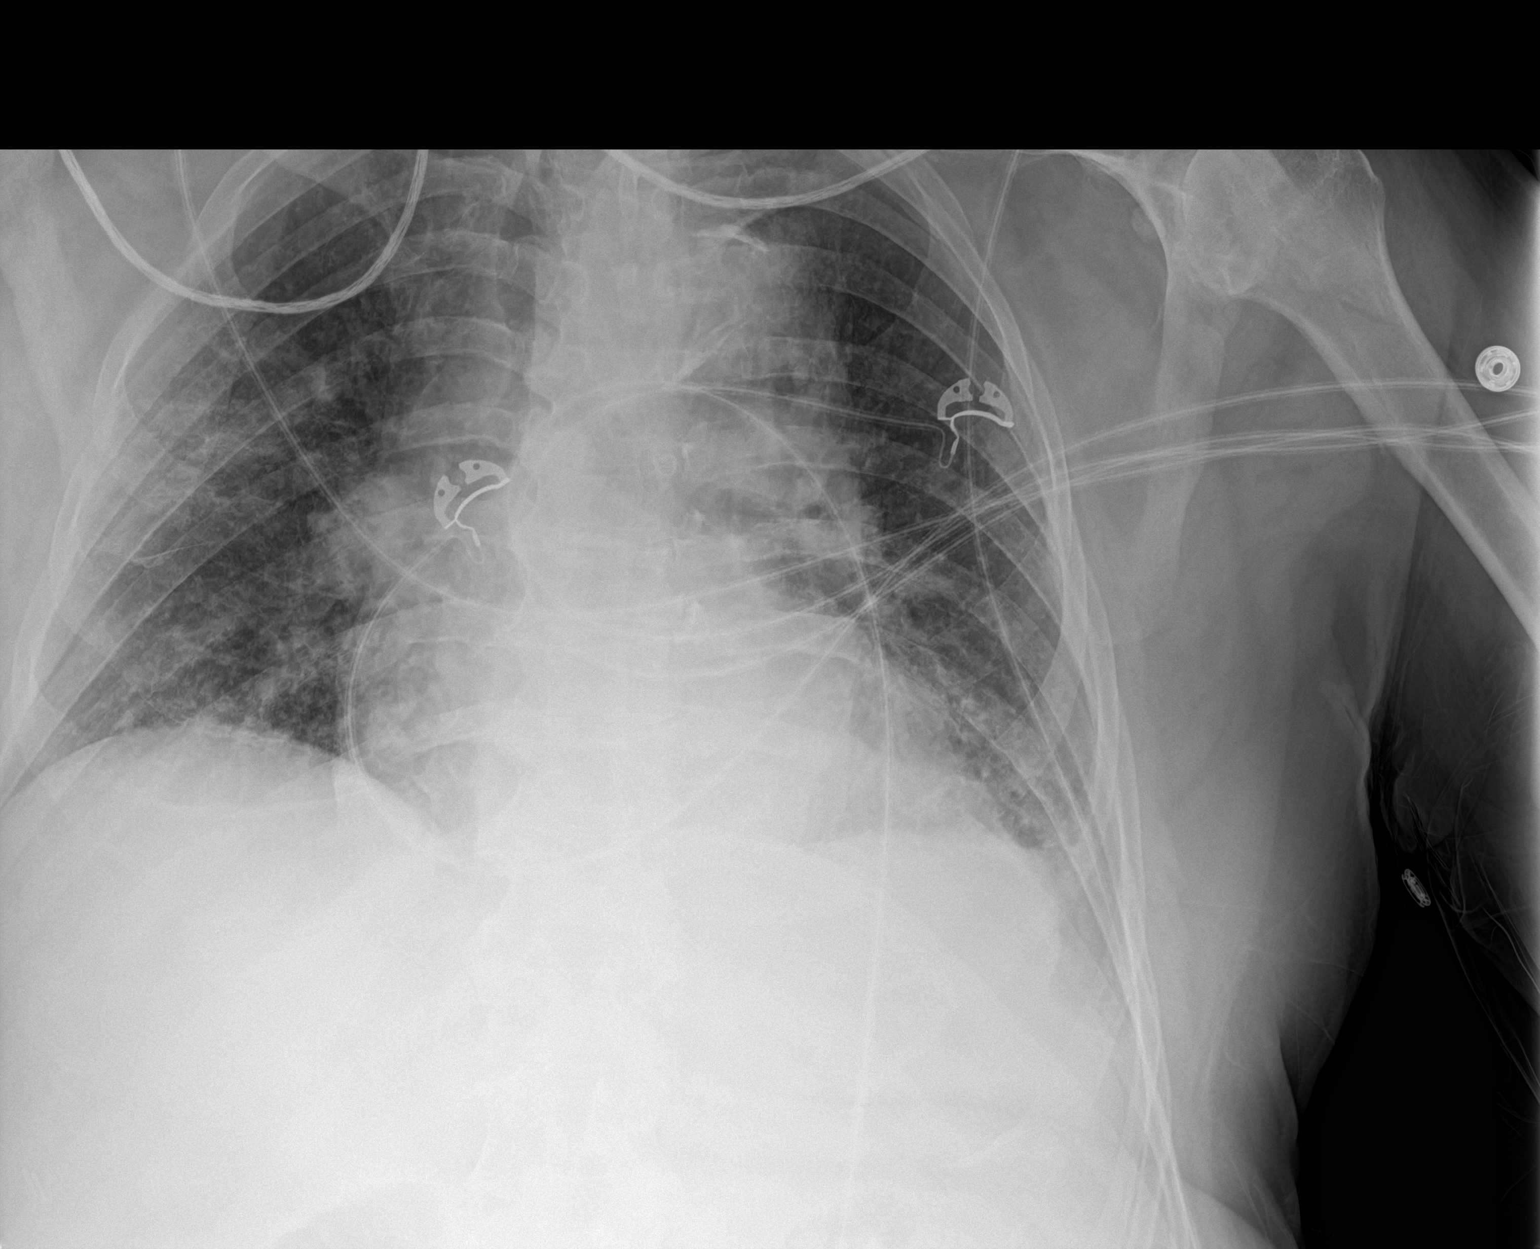

[1 of 1 positions shown; findings below may reference images not displayed]

FINDINGS: Heart size at the upper limits of normal, unchanged. Aortic
atherosclerosis. Again demonstrated ill-defined airspace opacities
within the right mid to lower lung field and left lung base.
Question mild interval improvement on the left as compared to prior
chest radiograph 04/14/2019. No sizable pleural effusion or evidence
of pneumothorax. Overlying cardiac monitoring leads.
IMPRESSION: Redemonstrated ill-defined airspace opacities within the right mid
to lower lung field and left lung base. Question mild interval
improvement on the left as compared to prior chest radiograph
04/14/2019. Findings favored to reflect multifocal pneumonia.

## 2020-11-07 DIAGNOSIS — K5909 Other constipation: Secondary | ICD-10-CM | POA: Diagnosis not present

## 2020-11-07 DIAGNOSIS — Z0000 Encounter for general adult medical examination without abnormal findings: Secondary | ICD-10-CM | POA: Diagnosis not present

## 2020-11-07 DIAGNOSIS — I10 Essential (primary) hypertension: Secondary | ICD-10-CM | POA: Diagnosis not present

## 2020-11-07 DIAGNOSIS — N401 Enlarged prostate with lower urinary tract symptoms: Secondary | ICD-10-CM | POA: Diagnosis not present

## 2020-11-07 DIAGNOSIS — N1831 Chronic kidney disease, stage 3a: Secondary | ICD-10-CM | POA: Diagnosis not present

## 2020-11-07 DIAGNOSIS — E1169 Type 2 diabetes mellitus with other specified complication: Secondary | ICD-10-CM | POA: Diagnosis not present

## 2020-11-07 DIAGNOSIS — R351 Nocturia: Secondary | ICD-10-CM | POA: Diagnosis not present

## 2020-11-07 DIAGNOSIS — E785 Hyperlipidemia, unspecified: Secondary | ICD-10-CM | POA: Diagnosis not present

## 2020-11-07 DIAGNOSIS — E7800 Pure hypercholesterolemia, unspecified: Secondary | ICD-10-CM | POA: Diagnosis not present

## 2020-11-11 DIAGNOSIS — Z23 Encounter for immunization: Secondary | ICD-10-CM | POA: Diagnosis not present

## 2020-11-15 DIAGNOSIS — R17 Unspecified jaundice: Secondary | ICD-10-CM | POA: Diagnosis not present

## 2020-11-15 DIAGNOSIS — Z6829 Body mass index (BMI) 29.0-29.9, adult: Secondary | ICD-10-CM | POA: Diagnosis not present

## 2020-11-15 DIAGNOSIS — Z20828 Contact with and (suspected) exposure to other viral communicable diseases: Secondary | ICD-10-CM | POA: Diagnosis not present

## 2020-11-15 DIAGNOSIS — Z9049 Acquired absence of other specified parts of digestive tract: Secondary | ICD-10-CM | POA: Diagnosis not present

## 2020-11-15 DIAGNOSIS — K5900 Constipation, unspecified: Secondary | ICD-10-CM | POA: Diagnosis not present

## 2020-11-15 DIAGNOSIS — R932 Abnormal findings on diagnostic imaging of liver and biliary tract: Secondary | ICD-10-CM | POA: Diagnosis not present

## 2020-11-15 DIAGNOSIS — R509 Fever, unspecified: Secondary | ICD-10-CM | POA: Diagnosis not present

## 2020-12-11 DIAGNOSIS — R0982 Postnasal drip: Secondary | ICD-10-CM | POA: Diagnosis not present

## 2020-12-11 DIAGNOSIS — R351 Nocturia: Secondary | ICD-10-CM | POA: Diagnosis not present

## 2020-12-11 DIAGNOSIS — J309 Allergic rhinitis, unspecified: Secondary | ICD-10-CM | POA: Diagnosis not present

## 2020-12-11 DIAGNOSIS — Z6829 Body mass index (BMI) 29.0-29.9, adult: Secondary | ICD-10-CM | POA: Diagnosis not present

## 2020-12-11 DIAGNOSIS — N401 Enlarged prostate with lower urinary tract symptoms: Secondary | ICD-10-CM | POA: Diagnosis not present

## 2020-12-11 DIAGNOSIS — R053 Chronic cough: Secondary | ICD-10-CM | POA: Diagnosis not present

## 2020-12-25 DIAGNOSIS — R053 Chronic cough: Secondary | ICD-10-CM | POA: Diagnosis not present

## 2020-12-26 DIAGNOSIS — R053 Chronic cough: Secondary | ICD-10-CM | POA: Diagnosis not present

## 2020-12-26 DIAGNOSIS — E8881 Metabolic syndrome: Secondary | ICD-10-CM | POA: Diagnosis not present

## 2020-12-26 DIAGNOSIS — Z6829 Body mass index (BMI) 29.0-29.9, adult: Secondary | ICD-10-CM | POA: Diagnosis not present

## 2021-01-10 DIAGNOSIS — I700 Atherosclerosis of aorta: Secondary | ICD-10-CM | POA: Diagnosis not present

## 2021-01-10 DIAGNOSIS — R911 Solitary pulmonary nodule: Secondary | ICD-10-CM | POA: Diagnosis not present

## 2021-01-10 DIAGNOSIS — R053 Chronic cough: Secondary | ICD-10-CM | POA: Diagnosis not present

## 2021-02-12 DIAGNOSIS — E1169 Type 2 diabetes mellitus with other specified complication: Secondary | ICD-10-CM | POA: Diagnosis not present

## 2021-02-12 DIAGNOSIS — N1831 Chronic kidney disease, stage 3a: Secondary | ICD-10-CM | POA: Diagnosis not present

## 2021-02-12 DIAGNOSIS — Z23 Encounter for immunization: Secondary | ICD-10-CM | POA: Diagnosis not present

## 2021-02-12 DIAGNOSIS — E785 Hyperlipidemia, unspecified: Secondary | ICD-10-CM | POA: Diagnosis not present

## 2021-02-12 DIAGNOSIS — M19012 Primary osteoarthritis, left shoulder: Secondary | ICD-10-CM | POA: Diagnosis not present

## 2021-02-12 DIAGNOSIS — Z6829 Body mass index (BMI) 29.0-29.9, adult: Secondary | ICD-10-CM | POA: Diagnosis not present

## 2021-03-14 DIAGNOSIS — J329 Chronic sinusitis, unspecified: Secondary | ICD-10-CM | POA: Diagnosis not present

## 2021-03-14 DIAGNOSIS — J40 Bronchitis, not specified as acute or chronic: Secondary | ICD-10-CM | POA: Diagnosis not present

## 2021-03-14 DIAGNOSIS — R051 Acute cough: Secondary | ICD-10-CM | POA: Diagnosis not present

## 2021-03-14 DIAGNOSIS — Z6829 Body mass index (BMI) 29.0-29.9, adult: Secondary | ICD-10-CM | POA: Diagnosis not present

## 2021-03-14 DIAGNOSIS — Z20828 Contact with and (suspected) exposure to other viral communicable diseases: Secondary | ICD-10-CM | POA: Diagnosis not present

## 2021-03-20 DIAGNOSIS — Z6829 Body mass index (BMI) 29.0-29.9, adult: Secondary | ICD-10-CM | POA: Diagnosis not present

## 2021-03-20 DIAGNOSIS — J329 Chronic sinusitis, unspecified: Secondary | ICD-10-CM | POA: Diagnosis not present

## 2021-03-20 DIAGNOSIS — J40 Bronchitis, not specified as acute or chronic: Secondary | ICD-10-CM | POA: Diagnosis not present

## 2021-04-03 DIAGNOSIS — J329 Chronic sinusitis, unspecified: Secondary | ICD-10-CM | POA: Diagnosis not present

## 2021-04-03 DIAGNOSIS — Z6829 Body mass index (BMI) 29.0-29.9, adult: Secondary | ICD-10-CM | POA: Diagnosis not present

## 2021-04-03 DIAGNOSIS — J40 Bronchitis, not specified as acute or chronic: Secondary | ICD-10-CM | POA: Diagnosis not present

## 2021-04-03 DIAGNOSIS — Z23 Encounter for immunization: Secondary | ICD-10-CM | POA: Diagnosis not present

## 2021-04-03 DIAGNOSIS — S91112A Laceration without foreign body of left great toe without damage to nail, initial encounter: Secondary | ICD-10-CM | POA: Diagnosis not present

## 2021-04-21 DIAGNOSIS — Z6829 Body mass index (BMI) 29.0-29.9, adult: Secondary | ICD-10-CM | POA: Diagnosis not present

## 2021-04-21 DIAGNOSIS — Z1331 Encounter for screening for depression: Secondary | ICD-10-CM | POA: Diagnosis not present

## 2021-04-21 DIAGNOSIS — M19012 Primary osteoarthritis, left shoulder: Secondary | ICD-10-CM | POA: Diagnosis not present

## 2021-04-21 DIAGNOSIS — S91113D Laceration without foreign body of unspecified great toe without damage to nail, subsequent encounter: Secondary | ICD-10-CM | POA: Diagnosis not present

## 2021-05-19 DIAGNOSIS — N1831 Chronic kidney disease, stage 3a: Secondary | ICD-10-CM | POA: Diagnosis not present

## 2021-05-19 DIAGNOSIS — M19012 Primary osteoarthritis, left shoulder: Secondary | ICD-10-CM | POA: Diagnosis not present

## 2021-05-19 DIAGNOSIS — E1169 Type 2 diabetes mellitus with other specified complication: Secondary | ICD-10-CM | POA: Diagnosis not present

## 2021-05-19 DIAGNOSIS — E785 Hyperlipidemia, unspecified: Secondary | ICD-10-CM | POA: Diagnosis not present

## 2021-05-19 DIAGNOSIS — Z6829 Body mass index (BMI) 29.0-29.9, adult: Secondary | ICD-10-CM | POA: Diagnosis not present

## 2021-05-19 DIAGNOSIS — I700 Atherosclerosis of aorta: Secondary | ICD-10-CM | POA: Diagnosis not present

## 2021-05-19 DIAGNOSIS — E1165 Type 2 diabetes mellitus with hyperglycemia: Secondary | ICD-10-CM | POA: Diagnosis not present

## 2021-08-18 DIAGNOSIS — N1831 Chronic kidney disease, stage 3a: Secondary | ICD-10-CM | POA: Diagnosis not present

## 2021-08-18 DIAGNOSIS — I10 Essential (primary) hypertension: Secondary | ICD-10-CM | POA: Diagnosis not present

## 2021-08-18 DIAGNOSIS — E1169 Type 2 diabetes mellitus with other specified complication: Secondary | ICD-10-CM | POA: Diagnosis not present

## 2021-08-18 DIAGNOSIS — Z6828 Body mass index (BMI) 28.0-28.9, adult: Secondary | ICD-10-CM | POA: Diagnosis not present

## 2021-08-18 DIAGNOSIS — E785 Hyperlipidemia, unspecified: Secondary | ICD-10-CM | POA: Diagnosis not present

## 2021-11-10 DIAGNOSIS — R001 Bradycardia, unspecified: Secondary | ICD-10-CM | POA: Diagnosis not present

## 2021-11-10 DIAGNOSIS — Z79899 Other long term (current) drug therapy: Secondary | ICD-10-CM | POA: Diagnosis not present

## 2021-11-10 DIAGNOSIS — I700 Atherosclerosis of aorta: Secondary | ICD-10-CM | POA: Diagnosis not present

## 2021-11-10 DIAGNOSIS — E1169 Type 2 diabetes mellitus with other specified complication: Secondary | ICD-10-CM | POA: Diagnosis not present

## 2021-11-10 DIAGNOSIS — E7800 Pure hypercholesterolemia, unspecified: Secondary | ICD-10-CM | POA: Diagnosis not present

## 2021-11-10 DIAGNOSIS — Z6828 Body mass index (BMI) 28.0-28.9, adult: Secondary | ICD-10-CM | POA: Diagnosis not present

## 2021-11-10 DIAGNOSIS — Z0000 Encounter for general adult medical examination without abnormal findings: Secondary | ICD-10-CM | POA: Diagnosis not present

## 2021-11-10 DIAGNOSIS — N1831 Chronic kidney disease, stage 3a: Secondary | ICD-10-CM | POA: Diagnosis not present

## 2021-11-10 DIAGNOSIS — E785 Hyperlipidemia, unspecified: Secondary | ICD-10-CM | POA: Diagnosis not present

## 2021-11-13 ENCOUNTER — Other Ambulatory Visit: Payer: Self-pay

## 2021-11-13 DIAGNOSIS — H6993 Unspecified Eustachian tube disorder, bilateral: Secondary | ICD-10-CM | POA: Insufficient documentation

## 2021-11-13 DIAGNOSIS — H6983 Other specified disorders of Eustachian tube, bilateral: Secondary | ICD-10-CM | POA: Insufficient documentation

## 2021-11-13 DIAGNOSIS — I951 Orthostatic hypotension: Secondary | ICD-10-CM | POA: Insufficient documentation

## 2021-11-13 DIAGNOSIS — I7090 Unspecified atherosclerosis: Secondary | ICD-10-CM | POA: Insufficient documentation

## 2021-11-13 DIAGNOSIS — R0600 Dyspnea, unspecified: Secondary | ICD-10-CM | POA: Insufficient documentation

## 2021-11-13 DIAGNOSIS — M79662 Pain in left lower leg: Secondary | ICD-10-CM | POA: Insufficient documentation

## 2021-11-13 DIAGNOSIS — R55 Syncope and collapse: Secondary | ICD-10-CM | POA: Insufficient documentation

## 2021-11-13 DIAGNOSIS — M7989 Other specified soft tissue disorders: Secondary | ICD-10-CM | POA: Insufficient documentation

## 2021-11-13 DIAGNOSIS — J309 Allergic rhinitis, unspecified: Secondary | ICD-10-CM | POA: Insufficient documentation

## 2021-11-13 DIAGNOSIS — E7800 Pure hypercholesterolemia, unspecified: Secondary | ICD-10-CM | POA: Insufficient documentation

## 2021-11-13 DIAGNOSIS — Z6829 Body mass index (BMI) 29.0-29.9, adult: Secondary | ICD-10-CM | POA: Insufficient documentation

## 2021-11-13 DIAGNOSIS — I6529 Occlusion and stenosis of unspecified carotid artery: Secondary | ICD-10-CM | POA: Insufficient documentation

## 2021-11-13 DIAGNOSIS — R635 Abnormal weight gain: Secondary | ICD-10-CM | POA: Insufficient documentation

## 2021-11-13 DIAGNOSIS — E785 Hyperlipidemia, unspecified: Secondary | ICD-10-CM | POA: Insufficient documentation

## 2021-11-13 DIAGNOSIS — N529 Male erectile dysfunction, unspecified: Secondary | ICD-10-CM | POA: Insufficient documentation

## 2021-11-13 DIAGNOSIS — A779 Spotted fever, unspecified: Secondary | ICD-10-CM | POA: Insufficient documentation

## 2021-11-13 DIAGNOSIS — N1831 Chronic kidney disease, stage 3a: Secondary | ICD-10-CM | POA: Insufficient documentation

## 2021-11-13 DIAGNOSIS — I2510 Atherosclerotic heart disease of native coronary artery without angina pectoris: Secondary | ICD-10-CM | POA: Insufficient documentation

## 2021-11-13 DIAGNOSIS — E041 Nontoxic single thyroid nodule: Secondary | ICD-10-CM | POA: Insufficient documentation

## 2021-11-13 DIAGNOSIS — I700 Atherosclerosis of aorta: Secondary | ICD-10-CM | POA: Insufficient documentation

## 2021-11-13 DIAGNOSIS — H8109 Meniere's disease, unspecified ear: Secondary | ICD-10-CM | POA: Insufficient documentation

## 2021-11-13 DIAGNOSIS — H6123 Impacted cerumen, bilateral: Secondary | ICD-10-CM | POA: Insufficient documentation

## 2021-11-13 DIAGNOSIS — K5909 Other constipation: Secondary | ICD-10-CM | POA: Insufficient documentation

## 2021-11-13 DIAGNOSIS — Z8719 Personal history of other diseases of the digestive system: Secondary | ICD-10-CM | POA: Insufficient documentation

## 2021-12-08 ENCOUNTER — Ambulatory Visit: Payer: Medicare HMO | Attending: Cardiology | Admitting: Cardiology

## 2021-12-08 ENCOUNTER — Encounter: Payer: Self-pay | Admitting: Cardiology

## 2021-12-08 VITALS — BP 128/78 | HR 85 | Ht 71.0 in | Wt 198.6 lb

## 2021-12-08 DIAGNOSIS — E1169 Type 2 diabetes mellitus with other specified complication: Secondary | ICD-10-CM | POA: Diagnosis not present

## 2021-12-08 DIAGNOSIS — R001 Bradycardia, unspecified: Secondary | ICD-10-CM

## 2021-12-08 DIAGNOSIS — H02401 Unspecified ptosis of right eyelid: Secondary | ICD-10-CM

## 2021-12-08 DIAGNOSIS — I700 Atherosclerosis of aorta: Secondary | ICD-10-CM | POA: Diagnosis not present

## 2021-12-08 DIAGNOSIS — R011 Cardiac murmur, unspecified: Secondary | ICD-10-CM

## 2021-12-08 DIAGNOSIS — E785 Hyperlipidemia, unspecified: Secondary | ICD-10-CM | POA: Diagnosis not present

## 2021-12-08 HISTORY — DX: Bradycardia, unspecified: R00.1

## 2021-12-08 HISTORY — DX: Cardiac murmur, unspecified: R01.1

## 2021-12-08 HISTORY — DX: Unspecified ptosis of right eyelid: H02.401

## 2021-12-08 NOTE — Progress Notes (Signed)
Cardiology Office Note:    Date:  12/08/2021   ID:  Adam Brady, DOB 02/18/1934, MRN [id number]  PCP:  Adam Malta, MD  Cardiologist:  Adam Brothers, MD   Referring MD: Adam Malta, MD    ASSESSMENT:    1. Atherosclerosis of aorta (HCC)   2. Type 2 diabetes mellitus with hyperlipidemia (HCC)   3. Murmur, cardiac   4. Cardiac murmur   5. Bradycardia    PLAN:    In order of problems listed above:  Primary prevention stressed with the patient.  Importance of compliance with diet and medication stressed any vocalized understanding.  He was advised to walk to the best of his ability on a regular basis. Essential hypertension: Blood pressure stable and diet was emphasized. History of bradycardia cardia: Heart rate is satisfactory.  He will keep a track of his heart rates at home with his blood pressure readings and get it to [place] in a week or 2.  He is asymptomatic. History of aortic atherosclerosis.  Lipids need to be monitored.  Goal LDL must be less than 70.  This is followed by primary care.  Diet emphasized. Cardiac murmur: Echocardiogram will be done to assess murmur heard on auscultation. Patient will be seen in follow-up appointment in 6 months or earlier if the patient has any concerns    Medication Adjustments/Labs and Tests Ordered: Current medicines are reviewed at length with the patient today.  Concerns regarding medicines are outlined above.  Orders Placed This Encounter  Procedures   EKG 12-Lead   ECHOCARDIOGRAM COMPLETE   No orders of the defined types were placed in this encounter.    History of Present Illness:    Adam Brady is a 88 y.o. male who is being seen today for the evaluation of bradycardia at the request of Adam Malta, MD. patient is a pleasant 88 year old gentleman.  He has past medical history of essential hypertension, aortic atherosclerosis.  I am not clear for coronary artery disease.  He was noted to have low heart  rate and sent for evaluation.  Patient denies any dizziness syncope or any such episodes.  He takes care of activities of daily living.  He denies any chest pain orthopnea or PND.  No shortness of breath on exertion.  At the time of my evaluation, the patient is alert awake oriented and in no distress.  His wife accompanies him for the visit.  Past Medical History:  Diagnosis Date   Abnormal weight gain    Acute respiratory failure due to COVID-19 (HCC) 04/16/2019   Allergic rhinitis with postnasal drip    Atherosclerosis of aorta (HCC)    Atherosclerosis of native coronary artery without angina pectoris    BMI 29.0-29.9,adult    CAD (coronary artery disease) 04/16/2019   Calcification of artery    Cardiac murmur 12/08/2021   Carotid artery stenosis    Ceruminosis, bilateral    Chronic constipation    Dyspnea    ED (erectile dysfunction)    Eustachian tube dysfunction, bilateral    History of hiatal hernia    HLD (hyperlipidemia) 04/16/2019   HTN (hypertension) 04/16/2019   Mild hypercholesterolemia    Near syncope    Orthostatic hypotension    Pain and swelling of left lower leg    Pneumonia due to COVID-19 virus 04/16/2019   Postoperative examination 05/11/2016   Rickettsiosis, tick-borne    Stage 3a chronic kidney disease (CKD) (HCC)    Stage 3a chronic kidney disease (CKD) (HCC)  Thyroid nodule    Type 2 diabetes mellitus with hyperlipidemia (HCC)    Type 2 diabetes mellitus with hyperlipidemia (HCC)    Vertigo, labyrinthine     Past Surgical History:  Procedure Laterality Date   CARDIAC CATHETERIZATION     CHOLECYSTECTOMY, LAPAROSCOPIC     HERNIA REPAIR     X 3    Current Medications: Current Meds  Medication Sig   albuterol (VENTOLIN HFA) 108 (90 Base) MCG/ACT inhaler Inhale 1-2 puffs into the lungs every 6 (six) hours as needed for wheezing or shortness of breath.   Apoaequorin (PREVAGEN PO) Take 1 tablet by mouth daily.   Aspirin-Caffeine (BAYER BACK & BODY) 500-32.5  MG TABS Take 1 tablet by mouth as needed (pain).   Coenzyme Q10 200 MG capsule Take 200 mg by mouth daily.   cyclobenzaprine (FLEXERIL) 5 MG tablet Take 5 mg by mouth at bedtime as needed for pain.   ferrous sulfate 325 (65 FE) MG tablet Take 325 mg by mouth daily with breakfast.   fluticasone (FLONASE) 50 MCG/ACT nasal spray Place 2 sprays into both nostrils daily as needed for allergies.   GARLIC PO Take 1,245 mg by mouth daily.   LINZESS 145 MCG CAPS capsule Take 145 mcg by mouth daily.   losartan (COZAAR) 50 MG tablet Take 50 mg by mouth daily.   Meclizine HCl 25 MG CHEW Chew 1 tablet by mouth 2 (two) times daily as needed for dizziness.   Multiple Vitamins-Minerals (MULTIVITAMIN/EXTRA VITAMIN D3 PO) Take 1 tablet by mouth daily.   simvastatin (ZOCOR) 40 MG tablet Take 40 mg by mouth daily.   tamsulosin (FLOMAX) 0.4 MG CAPS capsule Take 0.4 mg by mouth daily.   traMADol (ULTRAM) 50 MG tablet Take 50 mg by mouth 3 (three) times daily as needed for pain.     Allergies:   Nitroglycerin   Social History   Socioeconomic History   Marital status: Married    Spouse name: Betsie   Number of children: Not on file   Years of education: Not on file   Highest education level: Not on file  Occupational History   Not on file  Tobacco Use   Smoking status: Former   Smokeless tobacco: Never  Vaping Use   Vaping Use: Never used  Substance and Sexual Activity   Alcohol use: Not Currently   Drug use: Not Currently   Sexual activity: Not on file  Other Topics Concern   Not on file  Social History Narrative   Not on file   Social Determinants of Health   Financial Resource Strain: Not on file  Food Insecurity: Not on file  Transportation Needs: Not on file  Physical Activity: Not on file  Stress: Not on file  Social Connections: Not on file     Family History: The patient's family history includes CVA in his father; Depression in his mother; Heart disease in his father; Heart  failure in his mother; Hypercholesterolemia in his father; Hypertension in his father; Psychosis in his mother.  ROS:   Please see the history of present illness.    All other systems reviewed and are negative.  EKGs/Labs/Other Studies Reviewed:    The following studies were reviewed today: EKG revealed sinus rhythm and nonspecific ST-T changes   Recent Labs: No results found for requested labs within last 365 days.  Recent Lipid Panel No results found for: "CHOL", "TRIG", "HDL", "CHOLHDL", "VLDL", "LDLCALC", "LDLDIRECT"  Physical Exam:    VS:  BP  128/78   Pulse 85   Ht 5\' 11"  (1.803 m)   Wt 198 lb 9.6 oz (90.1 kg)   SpO2 95%   BMI 27.70 kg/m     Wt Readings from Last 3 Encounters:  12/08/21 198 lb 9.6 oz (90.1 kg)  04/16/19 214 lb 1.1 oz (97.1 kg)     GEN: Patient is in no acute distress HEENT: Normal NECK: No JVD; No carotid bruits LYMPHATICS: No lymphadenopathy CARDIAC: S1 S2 regular, 2/6 systolic murmur at the apex. RESPIRATORY:  Clear to auscultation without rales, wheezing or rhonchi  ABDOMEN: Soft, non-tender, non-distended MUSCULOSKELETAL:  No edema; No deformity  SKIN: Warm and dry NEUROLOGIC:  Alert and oriented x 3 PSYCHIATRIC:  Normal affect    Signed, 06/14/19, MD  12/08/2021 11:14 AM    [facility]

## 2021-12-08 NOTE — Patient Instructions (Signed)
Please keep a BP log for 2 weeks and send by MyChart or mail.  Blood Pressure Record Sheet To take your blood pressure, you will need a blood pressure machine. You can buy a blood pressure machine (blood pressure monitor) at your clinic, drug store, or online. When choosing one, consider: An automatic monitor that has an arm cuff. A cuff that wraps snugly around your upper arm. You should be able to fit only one finger between your arm and the cuff. A device that stores blood pressure reading results. Do not choose a monitor that measures your blood pressure from your wrist or finger. Follow your health care provider's instructions for how to take your blood pressure. To use this form: Get one reading in the morning (a.m.) 1-2 hours after you take any medicines. Get one reading in the evening (p.m.) before supper. Write down the results in the spaces on this form.  Make a follow-up appointment with your health care provider to discuss the results. Blood pressure log Date: _______________________  a.m. _____________________(1st reading) HR___________            p.m. _____________________(2nd reading) HR__________  Date: _______________________  a.m. _____________________(1st reading) HR___________            p.m. _____________________(2nd reading) HR__________  Date: _______________________  a.m. _____________________(1st reading) HR___________            p.m. _____________________(2nd reading) HR__________  Date: _______________________  a.m. _____________________(1st reading) HR___________            p.m. _____________________(2nd reading) HR__________  Date: _______________________  a.m. _____________________(1st reading) HR___________            p.m. _____________________(2nd reading) HR__________  Date: _______________________  a.m. _____________________(1st reading) HR___________            p.m. _____________________(2nd reading) HR__________  Date:  _______________________  a.m. _____________________(1st reading) HR___________            p.m. _____________________(2nd reading) HR__________   This information is not intended to replace advice given to you by your health care provider. Make sure you discuss any questions you have with your health care provider. Document Revised: 07/19/2019 Document Reviewed: 07/19/2019 Elsevier Patient Education  [employer].    Medication Instructions:  Your physician recommends that you continue on your current medications as directed. Please refer to the Current Medication list given to you today.  *If you need a refill on your cardiac medications before your next appointment, please call your pharmacy*   Lab Work: None ordered If you have labs (blood work) drawn today and your tests are completely normal, you will receive your results only by: [place] (if you have MyChart) OR A paper copy in the mail If you have any lab test that is abnormal or we need to change your treatment, we will call you to review the results.   Testing/Procedures: Your physician has requested that you have an echocardiogram. Echocardiography is a painless test that uses sound waves to create images of your heart. It provides your doctor with information about the size and shape of your heart and how well your heart's chambers and valves are working. This procedure takes approximately one hour. There are no restrictions for this procedure.    Follow-Up: At [facility], you and your health needs are our priority.  As part of our continuing mission to provide you with exceptional heart care, we have created designated Provider Care Teams.  These Care Teams include your primary Cardiologist (physician) and  Advanced Practice Providers (APPs -  Physician Assistants and Nurse Practitioners) who all work together to provide you with the care you need, when you need it.  We recommend signing up for the patient  portal called "MyChart".  Sign up information is provided on this After Visit Summary.  MyChart is used to connect with patients for Virtual Visits (Telemedicine).  Patients are able to view lab/test results, encounter notes, upcoming appointments, etc.  Non-urgent messages can be sent to your provider as well.   To learn more about what you can do with MyChart, go to [email or web address].    Your next appointment:   9 month(s)  The format for your next appointment:   In Person  Provider:   Belva Crome, MD   Other Instructions Echocardiogram An echocardiogram is a test that uses sound waves (ultrasound) to produce images of the heart. Images from an echocardiogram can provide important information about: Heart size and shape. The size and thickness and movement of your heart's walls. Heart muscle function and strength. Heart valve function or if you have stenosis. Stenosis is when the heart valves are too narrow. If blood is flowing backward through the heart valves (regurgitation). A tumor or infectious growth around the heart valves. Areas of heart muscle that are not working well because of poor blood flow or injury from a heart attack. Aneurysm detection. An aneurysm is a weak or damaged part of an artery wall. The wall bulges out from the normal force of blood pumping through the body. Tell a health care provider about: Any allergies you have. All medicines you are taking, including vitamins, herbs, eye drops, creams, and over-the-counter medicines. Any blood disorders you have. Any surgeries you have had. Any medical conditions you have. Whether you are pregnant or may be pregnant. What are the risks? Generally, this is a safe test. However, problems may occur, including an allergic reaction to dye (contrast) that may be used during the test. What happens before the test? No specific preparation is needed. You may eat and drink normally. What happens during the  test? You will take off your clothes from the waist up and put on a hospital gown. Electrodes or electrocardiogram (ECG)patches may be placed on your chest. The electrodes or patches are then connected to a device that monitors your heart rate and rhythm. You will lie down on a table for an ultrasound exam. A gel will be applied to your chest to help sound waves pass through your skin. A handheld device, called a transducer, will be pressed against your chest and moved over your heart. The transducer produces sound waves that travel to your heart and bounce back (or "echo" back) to the transducer. These sound waves will be captured in real-time and changed into images of your heart that can be viewed on a video monitor. The images will be recorded on a computer and reviewed by your health care provider. You may be asked to change positions or hold your breath for a short time. This makes it easier to get different views or better views of your heart. In some cases, you may receive contrast through an IV in one of your veins. This can improve the quality of the pictures from your heart. The procedure may vary among health care providers and hospitals.   What can I expect after the test? You may return to your normal, everyday life, including diet, activities, and medicines, unless your health care provider tells you not to do  that. Follow these instructions at home: It is up to you to get the results of your test. Ask your health care provider, or the department that is doing the test, when your results will be ready. Keep all follow-up visits. This is important. Summary An echocardiogram is a test that uses sound waves (ultrasound) to produce images of the heart. Images from an echocardiogram can provide important information about the size and shape of your heart, heart muscle function, heart valve function, and other possible heart problems. You do not need to do anything to prepare before this  test. You may eat and drink normally. After the echocardiogram is completed, you may return to your normal, everyday life, unless your health care provider tells you not to do that. This information is not intended to replace advice given to you by your health care provider. Make sure you discuss any questions you have with your health care provider. Document Revised: 11/21/2019 Document Reviewed: 11/21/2019 Elsevier Patient Education  2021 [employer].

## 2021-12-17 ENCOUNTER — Ambulatory Visit: Payer: Medicare HMO | Attending: Cardiology

## 2021-12-17 DIAGNOSIS — R011 Cardiac murmur, unspecified: Secondary | ICD-10-CM

## 2021-12-17 LAB — ECHOCARDIOGRAM COMPLETE
Area-P 1/2: 2.29 cm2
Calc EF: 45.1 %
S' Lateral: 4.2 cm
Single Plane A2C EF: 43.1 %
Single Plane A4C EF: 49.2 %

## 2021-12-24 DIAGNOSIS — M461 Sacroiliitis, not elsewhere classified: Secondary | ICD-10-CM | POA: Diagnosis not present

## 2022-01-15 DIAGNOSIS — Z961 Presence of intraocular lens: Secondary | ICD-10-CM | POA: Diagnosis not present

## 2022-01-15 DIAGNOSIS — H02834 Dermatochalasis of left upper eyelid: Secondary | ICD-10-CM | POA: Diagnosis not present

## 2022-01-15 DIAGNOSIS — H52223 Regular astigmatism, bilateral: Secondary | ICD-10-CM | POA: Diagnosis not present

## 2022-01-15 DIAGNOSIS — H02831 Dermatochalasis of right upper eyelid: Secondary | ICD-10-CM | POA: Diagnosis not present

## 2022-01-27 DIAGNOSIS — Z6828 Body mass index (BMI) 28.0-28.9, adult: Secondary | ICD-10-CM | POA: Diagnosis not present

## 2022-01-27 DIAGNOSIS — K5909 Other constipation: Secondary | ICD-10-CM | POA: Diagnosis not present

## 2022-01-27 DIAGNOSIS — Z23 Encounter for immunization: Secondary | ICD-10-CM | POA: Diagnosis not present

## 2022-01-27 DIAGNOSIS — L819 Disorder of pigmentation, unspecified: Secondary | ICD-10-CM | POA: Diagnosis not present

## 2022-02-24 DIAGNOSIS — N401 Enlarged prostate with lower urinary tract symptoms: Secondary | ICD-10-CM | POA: Diagnosis not present

## 2022-02-24 DIAGNOSIS — R351 Nocturia: Secondary | ICD-10-CM | POA: Diagnosis not present

## 2022-02-24 DIAGNOSIS — N1831 Chronic kidney disease, stage 3a: Secondary | ICD-10-CM | POA: Diagnosis not present

## 2022-02-24 DIAGNOSIS — E1169 Type 2 diabetes mellitus with other specified complication: Secondary | ICD-10-CM | POA: Diagnosis not present

## 2022-02-24 DIAGNOSIS — L600 Ingrowing nail: Secondary | ICD-10-CM | POA: Diagnosis not present

## 2022-02-24 DIAGNOSIS — E785 Hyperlipidemia, unspecified: Secondary | ICD-10-CM | POA: Diagnosis not present

## 2022-02-24 DIAGNOSIS — K5909 Other constipation: Secondary | ICD-10-CM | POA: Diagnosis not present

## 2022-03-13 DIAGNOSIS — I739 Peripheral vascular disease, unspecified: Secondary | ICD-10-CM | POA: Diagnosis not present

## 2022-03-13 DIAGNOSIS — M79675 Pain in left toe(s): Secondary | ICD-10-CM | POA: Diagnosis not present

## 2022-03-13 DIAGNOSIS — L600 Ingrowing nail: Secondary | ICD-10-CM | POA: Diagnosis not present

## 2022-03-18 DIAGNOSIS — L918 Other hypertrophic disorders of the skin: Secondary | ICD-10-CM | POA: Diagnosis not present

## 2022-03-20 ENCOUNTER — Encounter (HOSPITAL_COMMUNITY): Payer: Medicare HMO

## 2022-03-24 DIAGNOSIS — H02831 Dermatochalasis of right upper eyelid: Secondary | ICD-10-CM | POA: Diagnosis not present

## 2022-03-24 DIAGNOSIS — H02834 Dermatochalasis of left upper eyelid: Secondary | ICD-10-CM | POA: Diagnosis not present

## 2022-03-31 ENCOUNTER — Other Ambulatory Visit: Payer: Self-pay | Admitting: Sports Medicine

## 2022-03-31 ENCOUNTER — Other Ambulatory Visit: Payer: Self-pay

## 2022-03-31 ENCOUNTER — Ambulatory Visit: Payer: Medicare HMO | Attending: Cardiology

## 2022-03-31 DIAGNOSIS — I739 Peripheral vascular disease, unspecified: Secondary | ICD-10-CM

## 2022-03-31 DIAGNOSIS — E782 Mixed hyperlipidemia: Secondary | ICD-10-CM | POA: Diagnosis not present

## 2022-03-31 DIAGNOSIS — R600 Localized edema: Secondary | ICD-10-CM | POA: Diagnosis not present

## 2022-04-01 ENCOUNTER — Ambulatory Visit: Payer: Medicare HMO | Attending: Cardiology

## 2022-04-01 DIAGNOSIS — I739 Peripheral vascular disease, unspecified: Secondary | ICD-10-CM

## 2022-04-10 DIAGNOSIS — L600 Ingrowing nail: Secondary | ICD-10-CM | POA: Diagnosis not present

## 2022-04-23 DIAGNOSIS — Z01818 Encounter for other preprocedural examination: Secondary | ICD-10-CM | POA: Diagnosis not present

## 2022-04-23 DIAGNOSIS — H02831 Dermatochalasis of right upper eyelid: Secondary | ICD-10-CM | POA: Diagnosis not present

## 2022-04-23 DIAGNOSIS — H02834 Dermatochalasis of left upper eyelid: Secondary | ICD-10-CM | POA: Diagnosis not present

## 2022-05-13 DIAGNOSIS — R058 Other specified cough: Secondary | ICD-10-CM | POA: Diagnosis not present

## 2022-05-13 DIAGNOSIS — R0981 Nasal congestion: Secondary | ICD-10-CM | POA: Diagnosis not present

## 2022-05-13 DIAGNOSIS — Z6828 Body mass index (BMI) 28.0-28.9, adult: Secondary | ICD-10-CM | POA: Diagnosis not present

## 2022-05-22 DIAGNOSIS — H02831 Dermatochalasis of right upper eyelid: Secondary | ICD-10-CM | POA: Diagnosis not present

## 2022-05-22 DIAGNOSIS — H02834 Dermatochalasis of left upper eyelid: Secondary | ICD-10-CM | POA: Diagnosis not present

## 2022-05-22 DIAGNOSIS — H53453 Other localized visual field defect, bilateral: Secondary | ICD-10-CM | POA: Diagnosis not present

## 2022-06-02 DIAGNOSIS — E1169 Type 2 diabetes mellitus with other specified complication: Secondary | ICD-10-CM | POA: Diagnosis not present

## 2022-06-02 DIAGNOSIS — R053 Chronic cough: Secondary | ICD-10-CM | POA: Diagnosis not present

## 2022-06-02 DIAGNOSIS — E785 Hyperlipidemia, unspecified: Secondary | ICD-10-CM | POA: Diagnosis not present

## 2022-06-02 DIAGNOSIS — Z6828 Body mass index (BMI) 28.0-28.9, adult: Secondary | ICD-10-CM | POA: Diagnosis not present

## 2022-06-02 DIAGNOSIS — N1831 Chronic kidney disease, stage 3a: Secondary | ICD-10-CM | POA: Diagnosis not present

## 2022-06-02 DIAGNOSIS — I700 Atherosclerosis of aorta: Secondary | ICD-10-CM | POA: Diagnosis not present

## 2022-06-10 DIAGNOSIS — R918 Other nonspecific abnormal finding of lung field: Secondary | ICD-10-CM | POA: Diagnosis not present

## 2022-06-10 DIAGNOSIS — R053 Chronic cough: Secondary | ICD-10-CM | POA: Diagnosis not present

## 2022-07-02 DIAGNOSIS — E119 Type 2 diabetes mellitus without complications: Secondary | ICD-10-CM | POA: Diagnosis not present

## 2022-07-02 DIAGNOSIS — W19XXXA Unspecified fall, initial encounter: Secondary | ICD-10-CM | POA: Diagnosis not present

## 2022-07-02 DIAGNOSIS — Z6828 Body mass index (BMI) 28.0-28.9, adult: Secondary | ICD-10-CM | POA: Diagnosis not present

## 2022-07-02 DIAGNOSIS — T148XXA Other injury of unspecified body region, initial encounter: Secondary | ICD-10-CM | POA: Diagnosis not present

## 2022-09-03 DIAGNOSIS — R197 Diarrhea, unspecified: Secondary | ICD-10-CM | POA: Diagnosis not present

## 2022-09-03 DIAGNOSIS — Z6828 Body mass index (BMI) 28.0-28.9, adult: Secondary | ICD-10-CM | POA: Diagnosis not present

## 2022-09-03 DIAGNOSIS — E1169 Type 2 diabetes mellitus with other specified complication: Secondary | ICD-10-CM | POA: Diagnosis not present

## 2022-09-03 DIAGNOSIS — Z1331 Encounter for screening for depression: Secondary | ICD-10-CM | POA: Diagnosis not present

## 2022-09-03 DIAGNOSIS — E785 Hyperlipidemia, unspecified: Secondary | ICD-10-CM | POA: Diagnosis not present

## 2022-09-10 DIAGNOSIS — R9389 Abnormal findings on diagnostic imaging of other specified body structures: Secondary | ICD-10-CM | POA: Diagnosis not present

## 2022-12-10 DIAGNOSIS — E1159 Type 2 diabetes mellitus with other circulatory complications: Secondary | ICD-10-CM | POA: Diagnosis not present

## 2022-12-10 DIAGNOSIS — Z6828 Body mass index (BMI) 28.0-28.9, adult: Secondary | ICD-10-CM | POA: Diagnosis not present

## 2022-12-10 DIAGNOSIS — I700 Atherosclerosis of aorta: Secondary | ICD-10-CM | POA: Diagnosis not present

## 2022-12-10 DIAGNOSIS — E1169 Type 2 diabetes mellitus with other specified complication: Secondary | ICD-10-CM | POA: Diagnosis not present

## 2022-12-10 DIAGNOSIS — I152 Hypertension secondary to endocrine disorders: Secondary | ICD-10-CM | POA: Diagnosis not present

## 2022-12-10 DIAGNOSIS — K5909 Other constipation: Secondary | ICD-10-CM | POA: Diagnosis not present

## 2022-12-10 DIAGNOSIS — Z79899 Other long term (current) drug therapy: Secondary | ICD-10-CM | POA: Diagnosis not present

## 2022-12-10 DIAGNOSIS — Z0000 Encounter for general adult medical examination without abnormal findings: Secondary | ICD-10-CM | POA: Diagnosis not present

## 2022-12-10 DIAGNOSIS — N1831 Chronic kidney disease, stage 3a: Secondary | ICD-10-CM | POA: Diagnosis not present

## 2022-12-10 DIAGNOSIS — E785 Hyperlipidemia, unspecified: Secondary | ICD-10-CM | POA: Diagnosis not present

## 2023-03-09 DIAGNOSIS — Z23 Encounter for immunization: Secondary | ICD-10-CM | POA: Diagnosis not present

## 2023-03-09 DIAGNOSIS — S39012A Strain of muscle, fascia and tendon of lower back, initial encounter: Secondary | ICD-10-CM | POA: Diagnosis not present

## 2023-03-09 DIAGNOSIS — Z6827 Body mass index (BMI) 27.0-27.9, adult: Secondary | ICD-10-CM | POA: Diagnosis not present

## 2023-03-09 DIAGNOSIS — E785 Hyperlipidemia, unspecified: Secondary | ICD-10-CM | POA: Diagnosis not present

## 2023-03-09 DIAGNOSIS — K5909 Other constipation: Secondary | ICD-10-CM | POA: Diagnosis not present

## 2023-03-09 DIAGNOSIS — E1169 Type 2 diabetes mellitus with other specified complication: Secondary | ICD-10-CM | POA: Diagnosis not present

## 2023-03-22 DIAGNOSIS — K5790 Diverticulosis of intestine, part unspecified, without perforation or abscess without bleeding: Secondary | ICD-10-CM | POA: Diagnosis not present

## 2023-03-22 DIAGNOSIS — K648 Other hemorrhoids: Secondary | ICD-10-CM | POA: Diagnosis not present

## 2023-03-22 DIAGNOSIS — K5900 Constipation, unspecified: Secondary | ICD-10-CM | POA: Diagnosis not present

## 2023-03-25 DIAGNOSIS — R001 Bradycardia, unspecified: Secondary | ICD-10-CM | POA: Diagnosis not present

## 2023-03-25 DIAGNOSIS — R008 Other abnormalities of heart beat: Secondary | ICD-10-CM | POA: Diagnosis not present

## 2023-03-25 DIAGNOSIS — Z01810 Encounter for preprocedural cardiovascular examination: Secondary | ICD-10-CM | POA: Diagnosis not present

## 2023-03-25 DIAGNOSIS — D72829 Elevated white blood cell count, unspecified: Secondary | ICD-10-CM | POA: Diagnosis not present

## 2023-03-25 DIAGNOSIS — I499 Cardiac arrhythmia, unspecified: Secondary | ICD-10-CM | POA: Diagnosis not present

## 2023-03-25 DIAGNOSIS — I350 Nonrheumatic aortic (valve) stenosis: Secondary | ICD-10-CM | POA: Diagnosis not present

## 2023-03-25 DIAGNOSIS — R079 Chest pain, unspecified: Secondary | ICD-10-CM | POA: Diagnosis not present

## 2023-03-25 DIAGNOSIS — R5383 Other fatigue: Secondary | ICD-10-CM | POA: Diagnosis not present

## 2023-03-25 DIAGNOSIS — I498 Other specified cardiac arrhythmias: Secondary | ICD-10-CM | POA: Diagnosis not present

## 2023-03-25 DIAGNOSIS — R053 Chronic cough: Secondary | ICD-10-CM | POA: Diagnosis not present

## 2023-04-02 DIAGNOSIS — R008 Other abnormalities of heart beat: Secondary | ICD-10-CM | POA: Diagnosis not present

## 2023-04-02 DIAGNOSIS — R0609 Other forms of dyspnea: Secondary | ICD-10-CM | POA: Diagnosis not present

## 2023-04-02 DIAGNOSIS — Z6828 Body mass index (BMI) 28.0-28.9, adult: Secondary | ICD-10-CM | POA: Diagnosis not present

## 2023-04-02 DIAGNOSIS — J849 Interstitial pulmonary disease, unspecified: Secondary | ICD-10-CM | POA: Diagnosis not present

## 2023-04-02 LAB — LAB REPORT - SCANNED

## 2023-04-12 DIAGNOSIS — E785 Hyperlipidemia, unspecified: Secondary | ICD-10-CM | POA: Diagnosis not present

## 2023-04-12 DIAGNOSIS — Z6827 Body mass index (BMI) 27.0-27.9, adult: Secondary | ICD-10-CM | POA: Diagnosis not present

## 2023-04-12 DIAGNOSIS — E1169 Type 2 diabetes mellitus with other specified complication: Secondary | ICD-10-CM | POA: Diagnosis not present

## 2023-04-12 DIAGNOSIS — R008 Other abnormalities of heart beat: Secondary | ICD-10-CM | POA: Diagnosis not present

## 2023-04-12 DIAGNOSIS — I959 Hypotension, unspecified: Secondary | ICD-10-CM | POA: Diagnosis not present

## 2023-04-12 DIAGNOSIS — J849 Interstitial pulmonary disease, unspecified: Secondary | ICD-10-CM | POA: Diagnosis not present

## 2023-04-22 ENCOUNTER — Other Ambulatory Visit: Payer: Self-pay

## 2023-04-22 DIAGNOSIS — R5383 Other fatigue: Secondary | ICD-10-CM | POA: Insufficient documentation

## 2023-04-22 DIAGNOSIS — R001 Bradycardia, unspecified: Secondary | ICD-10-CM | POA: Insufficient documentation

## 2023-04-22 DIAGNOSIS — Z6828 Body mass index (BMI) 28.0-28.9, adult: Secondary | ICD-10-CM | POA: Insufficient documentation

## 2023-04-22 NOTE — Progress Notes (Signed)
 Cardiology Office Note:  .   Date:  04/23/2023  ID:  Adam Brady, DOB 15-Apr-1933, MRN [id number] PCP: Clemmie Nest, MD   HeartCare Providers Cardiologist:  Derita Michelsen JONELLE Crape, MD    History of Present Illness: .   Adam Brady is a 89 y.o. male with a past medical history of HTN, bradycardia, aortic atherosclerosis, palpitations, DM2.   12/17/21 echo EF 55-60%, grade I DD, mild aortic regurgitation  Most recently evaluated by Dr. Crape on 12/08/21, an echo was arranged, he was advised to follow up in 6 months.   He presents today accompanied by his wife for follow-up after recent ED visit.  He apparently woke up in the middle of the night, was unusually cold, his wife checked his pulse ox and heart rate in noted that his heart rate was 40, pulse ox was in the 80s.  The following day she presented to an urgent care for follow-up, they obtained an EKG which was abnormal and he was transferred to [facility].  Evaluated in the emergency department, workup did not reveal any abnormalities with his EKG, labs were noncontributory, he was discharged home.  He offers no formal complaints today, he has been feeling well. He denies chest pain, palpitations, dyspnea, pnd, orthopnea, n, v, dizziness, syncope, edema, weight gain, or early satiety.   ROS: Review of Systems  All other systems reviewed and are negative.    Studies Reviewed: SABRA       EKG Interpretation Date/Time:  Friday April 23 2023 11:28:29 EST Ventricular Rate:  82 PR Interval:  154 QRS Duration:  98 QT Interval:  370 QTC Calculation: 432 R Axis:   -61  Text Interpretation: Sinus rhythm with Premature atrial complexes and Premature ventricular complexes or Fusion complexes Incomplete right bundle branch block Left anterior fascicular block No previous ECGs available Confirmed by Carlin Nest [phone]) on 04/23/2023 12:54:52 PM   Cardiac Studies & Procedures      ECHOCARDIOGRAM  ECHOCARDIOGRAM COMPLETE  12/17/2021  Narrative ECHOCARDIOGRAM REPORT    Patient Name:   Adam Brady Date of Exam: 12/17/2021 Medical Rec #:  [id number]      Height:       71.0 in Accession #:    [id number]     Weight:       198.6 lb Date of Birth:  1933/09/30     BSA:          2.102 m Patient Age:    87 years       BP:           128/78 mmHg Patient Gender: M              HR:           76 bpm. Exam Location:  [place]  Procedure: 2D Echo, Cardiac Doppler, Color Doppler and Strain Analysis  Indications:    Murmur R01.1  History:        Patient has no prior history of Echocardiogram examinations. CAD, Atherosclerosis of aorta, Signs/Symptoms:Near syncope and Dyspnea; Risk Factors:Hypertension, Diabetes and Dyslipidemia.  Sonographer:    Lynwood Silvas RDCS Referring Phys: CYRUS Harrol Novello JONELLE [facility]  IMPRESSIONS   1. Left ventricular ejection fraction, by estimation, is 55 to 60%. The left ventricle has normal function. The left ventricle has no regional wall motion abnormalities. Left ventricular diastolic parameters are consistent with Grade I diastolic dysfunction (impaired relaxation). 2. The aortic valve is normal in structure. Aortic valve regurgitation is mild. No aortic stenosis is present.  FINDINGS Left Ventricle: Left ventricular ejection fraction, by estimation, is 55 to 60%. The left ventricle has normal function. The left ventricle has no regional wall motion abnormalities. The left ventricular internal cavity size was normal in size. There is no left ventricular hypertrophy. Left ventricular diastolic parameters are consistent with Grade I diastolic dysfunction (impaired relaxation).  Right Ventricle: The right ventricular size is normal. No increase in right ventricular wall thickness. Right ventricular systolic function is normal. There is normal pulmonary artery systolic pressure. The tricuspid regurgitant velocity is 1.85 m/s, and with an assumed right atrial pressure of 3 mmHg, the estimated right  ventricular systolic pressure is 16.7 mmHg.  Left Atrium: Left atrial size was normal in size.  Right Atrium: Right atrial size was normal in size.  Pericardium: There is no evidence of pericardial effusion.  Mitral Valve: The mitral valve is normal in structure. No evidence of mitral valve regurgitation. No evidence of mitral valve stenosis.  Tricuspid Valve: The tricuspid valve is normal in structure. Tricuspid valve regurgitation is not demonstrated. No evidence of tricuspid stenosis.  Aortic Valve: The aortic valve is normal in structure. Aortic valve regurgitation is mild. No aortic stenosis is present.  Pulmonic Valve: The pulmonic valve was normal in structure. Pulmonic valve regurgitation is not visualized. No evidence of pulmonic stenosis.  Aorta: The aortic root is normal in size and structure.  Venous: The inferior vena cava is normal in size with greater than 50% respiratory variability, suggesting right atrial pressure of 3 mmHg.  IAS/Shunts: No atrial level shunt detected by color flow Doppler.   LEFT VENTRICLE PLAX 2D LVIDd:         5.90 cm     Diastology LVIDs:         4.20 cm     LV e' medial:    4.58 cm/s LV PW:         1.00 cm     LV E/e' medial:  13.1 LV IVS:        1.10 cm     LV e' lateral:   6.98 cm/s LVOT diam:     2.30 cm     LV E/e' lateral: 8.6 LV SV:         62 LV SV Index:   29 LVOT Area:     4.15 cm  LV Volumes (MOD) LV vol d, MOD A2C: 75.6 ml LV vol d, MOD A4C: 78.7 ml LV vol s, MOD A2C: 43.0 ml LV vol s, MOD A4C: 40.0 ml LV SV MOD A2C:     32.6 ml LV SV MOD A4C:     78.7 ml LV SV MOD BP:      34.9 ml  RIGHT VENTRICLE             IVC RV S prime:     12.30 cm/s  IVC diam: 1.80 cm TAPSE (M-mode): 2.4 cm  LEFT ATRIUM           Index        RIGHT ATRIUM           Index LA diam:      3.30 cm 1.57 cm/m   RA Area:     11.20 cm LA Vol (A2C): 40.9 ml 19.46 ml/m  RA Volume:   23.50 ml  11.18 ml/m LA Vol (A4C): 40.2 ml 19.12 ml/m AORTIC  VALVE LVOT Vmax:   64.00 cm/s LVOT Vmean:  44.200 cm/s LVOT VTI:    0.149 m  AORTA Ao  Root diam: 3.60 cm Ao Asc diam:  3.35 cm Ao Desc diam: 2.50 cm  MITRAL VALVE               TRICUSPID VALVE MV Area (PHT): 2.29 cm    TR Peak grad:   13.7 mmHg MV Decel Time: 332 msec    TR Vmax:        185.00 cm/s MV E velocity: 60.00 cm/s MV A velocity: 63.00 cm/s  SHUNTS MV E/A ratio:  0.95        Systemic VTI:  0.15 m Systemic Diam: 2.30 cm  Britten Parady Crape MD Electronically signed by Antonique Langford Crape MD Signature Date/Time: 12/17/2021/4:31:49 PM    Final             Risk Assessment/Calculations:             Physical Exam:   VS:  BP 112/68   Pulse 82   Ht 6' (1.829 m)   Wt 192 lb 3.2 oz (87.2 kg)   SpO2 94%   BMI 26.07 kg/m    Wt Readings from Last 3 Encounters:  04/23/23 192 lb 3.2 oz (87.2 kg)  12/08/21 198 lb 9.6 oz (90.1 kg)  04/16/19 214 lb 1.1 oz (97.1 kg)    GEN: Well nourished, well developed in no acute distress NECK: No JVD; No carotid bruits CARDIAC: RRR, no murmurs, rubs, gallops RESPIRATORY:  Clear to auscultation without rales, wheezing or rhonchi  ABDOMEN: Soft, non-tender, non-distended EXTREMITIES:  No edema; No deformity   ASSESSMENT AND PLAN: .   Irregular heart rate-he is asymptomatic, he offers no formal complaints.  Will arrange for 2-week monitor to assess for irregularities that were noted on his pulse ox.  EKG in the office today reveals normal sinus rhythm with PACs and PVCs.  He apparently had been started on metoprolol  after his visit to the ED, he is tolerating this well.  PCP is recently checked his labs and we will request a copy of that.  Aortic atherosclerosis-this was noted on CT imaging, Stable with no anginal symptoms. No indication for ischemic evaluation.    Dyslipidemia-this appears to be monitored by his PCP, continue Zocor  40 mg daily.  DM2-monitored by PCP, currently on Glucophage.       Dispo: Monitor for 2 weeks, follow up in 6  weeks, request labs from PCP.   Signed, Delon JAYSON Hoover, NP

## 2023-04-23 ENCOUNTER — Ambulatory Visit: Payer: Medicare HMO | Attending: Cardiology

## 2023-04-23 ENCOUNTER — Encounter: Payer: Self-pay | Admitting: Cardiology

## 2023-04-23 ENCOUNTER — Other Ambulatory Visit: Payer: Self-pay | Admitting: Cardiology

## 2023-04-23 ENCOUNTER — Ambulatory Visit: Payer: Medicare HMO | Attending: Cardiology | Admitting: Cardiology

## 2023-04-23 VITALS — BP 112/68 | HR 82 | Ht 72.0 in | Wt 192.2 lb

## 2023-04-23 DIAGNOSIS — I700 Atherosclerosis of aorta: Secondary | ICD-10-CM

## 2023-04-23 DIAGNOSIS — R001 Bradycardia, unspecified: Secondary | ICD-10-CM

## 2023-04-23 DIAGNOSIS — R002 Palpitations: Secondary | ICD-10-CM

## 2023-04-23 NOTE — Patient Instructions (Signed)
 Medication Instructions:  Your physician recommends that you continue on your current medications as directed. Please refer to the Current Medication list given to you today.  *If you need a refill on your cardiac medications before your next appointment, please call your pharmacy*   Lab Work: NONE If you have labs (blood work) drawn today and your tests are completely normal, you will receive your results only by: MyChart Message (if you have MyChart) OR A paper copy in the mail If you have any lab test that is abnormal or we need to change your treatment, we will call you to review the results.   Testing/Procedures: You have been asked to wear a Zio Heart Monitor today. It is to be worn for 14 days. Please remove the monitor on 05/07/23 and mail back in the box provided.  If you have any questions about the monitor please call the company at [phone]     Follow-Up: At [facility], you and your health needs are our priority.  As part of our continuing mission to provide you with exceptional heart care, we have created designated Provider Care Teams.  These Care Teams include your primary Cardiologist (physician) and Advanced Practice Providers (APPs -  Physician Assistants and Nurse Practitioners) who all work together to provide you with the care you need, when you need it.  We recommend signing up for the patient portal called MyChart.  Sign up information is provided on this After Visit Summary.  MyChart is used to connect with patients for Virtual Visits (Telemedicine).  Patients are able to view lab/test results, encounter notes, upcoming appointments, etc.  Non-urgent messages can be sent to your provider as well.   To learn more about what you can do with MyChart, go to [email or web address].    Your next appointment:   6 week(s)  Provider:   Delon Hoover, NP [place])    Other Instructions

## 2023-04-26 ENCOUNTER — Telehealth: Payer: Self-pay | Admitting: Cardiology

## 2023-04-26 MED ORDER — METOPROLOL TARTRATE 25 MG PO TABS: 12.5000 mg | ORAL_TABLET | Freq: Two times a day (BID) | ORAL | 3 refills | Status: DC

## 2023-04-26 NOTE — Telephone Encounter (Signed)
*  STAT* If patient is at the pharmacy, call can be transferred to refill team.   1. Which medications need to be refilled? (please list name of each medication and dose if known)   metoprolol  tartrate (LOPRESSOR ) 25 MG tablet     4. Which pharmacy/location (including street and city if local pharmacy) is medication to be sent to? [facility] - [place] - [place], [place] - [address] Phone: [phone]  Fax: [phone]       5. Do they need a 30 day or 90 day supply? 90

## 2023-05-03 DIAGNOSIS — K5900 Constipation, unspecified: Secondary | ICD-10-CM | POA: Diagnosis not present

## 2023-05-14 ENCOUNTER — Emergency Department (HOSPITAL_COMMUNITY): Payer: Medicare HMO

## 2023-05-14 ENCOUNTER — Other Ambulatory Visit: Payer: Self-pay

## 2023-05-14 ENCOUNTER — Inpatient Hospital Stay (HOSPITAL_COMMUNITY)
Admission: EM | Admit: 2023-05-14 | Discharge: 2023-05-16 | DRG: 309 | Disposition: A | Discharge status: Home / Self Care | Payer: Medicare HMO | Source: Ambulatory Visit | Attending: Internal Medicine | Admitting: Internal Medicine

## 2023-05-14 ENCOUNTER — Telehealth: Payer: Self-pay | Admitting: Cardiology

## 2023-05-14 DIAGNOSIS — E1122 Type 2 diabetes mellitus with diabetic chronic kidney disease: Secondary | ICD-10-CM | POA: Diagnosis present

## 2023-05-14 DIAGNOSIS — I129 Hypertensive chronic kidney disease with stage 1 through stage 4 chronic kidney disease, or unspecified chronic kidney disease: Secondary | ICD-10-CM | POA: Diagnosis present

## 2023-05-14 DIAGNOSIS — I77810 Thoracic aortic ectasia: Secondary | ICD-10-CM | POA: Diagnosis not present

## 2023-05-14 DIAGNOSIS — I441 Atrioventricular block, second degree: Principal | ICD-10-CM | POA: Diagnosis present

## 2023-05-14 DIAGNOSIS — Z818 Family history of other mental and behavioral disorders: Secondary | ICD-10-CM | POA: Diagnosis not present

## 2023-05-14 DIAGNOSIS — I495 Sick sinus syndrome: Secondary | ICD-10-CM | POA: Diagnosis not present

## 2023-05-14 DIAGNOSIS — I471 Supraventricular tachycardia, unspecified: Secondary | ICD-10-CM | POA: Diagnosis present

## 2023-05-14 DIAGNOSIS — Z83438 Family history of other disorder of lipoprotein metabolism and other lipidemia: Secondary | ICD-10-CM

## 2023-05-14 DIAGNOSIS — K5909 Other constipation: Secondary | ICD-10-CM | POA: Diagnosis not present

## 2023-05-14 DIAGNOSIS — E1169 Type 2 diabetes mellitus with other specified complication: Secondary | ICD-10-CM | POA: Diagnosis not present

## 2023-05-14 DIAGNOSIS — F05 Delirium due to known physiological condition: Secondary | ICD-10-CM | POA: Diagnosis not present

## 2023-05-14 DIAGNOSIS — N400 Enlarged prostate without lower urinary tract symptoms: Secondary | ICD-10-CM | POA: Diagnosis not present

## 2023-05-14 DIAGNOSIS — I459 Conduction disorder, unspecified: Secondary | ICD-10-CM | POA: Diagnosis not present

## 2023-05-14 DIAGNOSIS — E875 Hyperkalemia: Secondary | ICD-10-CM | POA: Diagnosis present

## 2023-05-14 DIAGNOSIS — E785 Hyperlipidemia, unspecified: Secondary | ICD-10-CM | POA: Diagnosis present

## 2023-05-14 DIAGNOSIS — Z8701 Personal history of pneumonia (recurrent): Secondary | ICD-10-CM | POA: Diagnosis not present

## 2023-05-14 DIAGNOSIS — Z66 Do not resuscitate: Secondary | ICD-10-CM | POA: Diagnosis not present

## 2023-05-14 DIAGNOSIS — I10 Essential (primary) hypertension: Secondary | ICD-10-CM | POA: Diagnosis present

## 2023-05-14 DIAGNOSIS — Z8616 Personal history of COVID-19: Secondary | ICD-10-CM

## 2023-05-14 DIAGNOSIS — Z87891 Personal history of nicotine dependence: Secondary | ICD-10-CM | POA: Diagnosis not present

## 2023-05-14 DIAGNOSIS — Z7984 Long term (current) use of oral hypoglycemic drugs: Secondary | ICD-10-CM | POA: Diagnosis not present

## 2023-05-14 DIAGNOSIS — N1831 Chronic kidney disease, stage 3a: Secondary | ICD-10-CM | POA: Diagnosis not present

## 2023-05-14 DIAGNOSIS — I700 Atherosclerosis of aorta: Secondary | ICD-10-CM | POA: Diagnosis present

## 2023-05-14 DIAGNOSIS — E7800 Pure hypercholesterolemia, unspecified: Secondary | ICD-10-CM | POA: Diagnosis present

## 2023-05-14 DIAGNOSIS — R9431 Abnormal electrocardiogram [ECG] [EKG]: Secondary | ICD-10-CM | POA: Diagnosis not present

## 2023-05-14 DIAGNOSIS — Z8249 Family history of ischemic heart disease and other diseases of the circulatory system: Secondary | ICD-10-CM | POA: Diagnosis not present

## 2023-05-14 DIAGNOSIS — I2510 Atherosclerotic heart disease of native coronary artery without angina pectoris: Secondary | ICD-10-CM | POA: Diagnosis present

## 2023-05-14 DIAGNOSIS — Z823 Family history of stroke: Secondary | ICD-10-CM

## 2023-05-14 DIAGNOSIS — R001 Bradycardia, unspecified: Secondary | ICD-10-CM | POA: Diagnosis not present

## 2023-05-14 DIAGNOSIS — Z888 Allergy status to other drugs, medicaments and biological substances status: Secondary | ICD-10-CM

## 2023-05-14 DIAGNOSIS — I771 Stricture of artery: Secondary | ICD-10-CM | POA: Diagnosis not present

## 2023-05-14 DIAGNOSIS — I493 Ventricular premature depolarization: Secondary | ICD-10-CM | POA: Diagnosis present

## 2023-05-14 DIAGNOSIS — I442 Atrioventricular block, complete: Secondary | ICD-10-CM | POA: Diagnosis not present

## 2023-05-14 DIAGNOSIS — Z79899 Other long term (current) drug therapy: Secondary | ICD-10-CM

## 2023-05-14 DIAGNOSIS — R451 Restlessness and agitation: Secondary | ICD-10-CM | POA: Diagnosis not present

## 2023-05-14 DIAGNOSIS — R002 Palpitations: Secondary | ICD-10-CM | POA: Diagnosis not present

## 2023-05-14 DIAGNOSIS — R079 Chest pain, unspecified: Secondary | ICD-10-CM | POA: Diagnosis not present

## 2023-05-14 LAB — COMPREHENSIVE METABOLIC PANEL
ALT: 13 U/L (ref 0–44)
AST: 42 U/L — ABNORMAL HIGH (ref 15–41)
Albumin: 3.6 g/dL (ref 3.5–5.0)
Alkaline Phosphatase: 106 U/L (ref 38–126)
Anion gap: 10 (ref 5–15)
BUN: 13 mg/dL (ref 8–23)
CO2: 23 mmol/L (ref 22–32)
Calcium: 9.3 mg/dL (ref 8.9–10.3)
Chloride: 101 mmol/L (ref 98–111)
Creatinine, Ser: 0.93 mg/dL (ref 0.61–1.24)
GFR, Estimated: >60 mL/min (ref >60)
Glucose, Bld: 239 mg/dL — ABNORMAL HIGH (ref 70–99)
Potassium: 5 mmol/L (ref 3.5–5.1)
Sodium: 134 mmol/L — ABNORMAL LOW (ref 135–145)
Total Bilirubin: 2.3 mg/dL — ABNORMAL HIGH (ref 0.0–1.2)
Total Protein: 6.3 g/dL — ABNORMAL LOW (ref 6.5–8.1)

## 2023-05-14 LAB — CBC WITH DIFFERENTIAL/PLATELET
Abs Immature Granulocytes: 0.02 10*3/uL (ref 0.00–0.07)
Basophils Absolute: 0 10*3/uL (ref 0.0–0.1)
Basophils Relative: 1 %
Eosinophils Absolute: 0.3 10*3/uL (ref 0.0–0.5)
Eosinophils Relative: 3 %
HCT: 46.9 % (ref 39.0–52.0)
Hemoglobin: 16 g/dL (ref 13.0–17.0)
Immature Granulocytes: 0 %
Lymphocytes Relative: 31 %
Lymphs Abs: 2.6 10*3/uL (ref 0.7–4.0)
MCH: 32.9 pg (ref 26.0–34.0)
MCHC: 34.1 g/dL (ref 30.0–36.0)
MCV: 96.3 fL (ref 80.0–100.0)
Monocytes Absolute: 0.8 10*3/uL (ref 0.1–1.0)
Monocytes Relative: 10 %
Neutro Abs: 4.6 10*3/uL (ref 1.7–7.7)
Neutrophils Relative %: 55 %
Platelets: 173 10*3/uL (ref 150–400)
RBC: 4.87 MIL/uL (ref 4.22–5.81)
RDW: 13.2 % (ref 11.5–15.5)
WBC: 8.3 10*3/uL (ref 4.0–10.5)
nRBC: 0 % (ref 0.0–0.2)

## 2023-05-14 LAB — TSH: TSH: 4.735 u[IU]/mL — ABNORMAL HIGH (ref 0.350–4.500)

## 2023-05-14 LAB — TROPONIN I (HIGH SENSITIVITY): Troponin I (High Sensitivity): 6 ng/L (ref <18)

## 2023-05-14 LAB — MAGNESIUM: Magnesium: 2.2 mg/dL (ref 1.7–2.4)

## 2023-05-14 NOTE — ED Provider Triage Note (Cosign Needed)
Emergency Medicine Provider Triage Evaluation Note  Adam Brady , a 89 y.o. male  was evaluated in triage.  Pt complains of bradycardia.  Review of Systems  Positive:  Negative:   Physical Exam  BP 120/75   Pulse 69   Temp 97.8 F (36.6 C) (Oral)   Resp 18   Ht 6' (1.829 m)   Wt 87.1 kg   SpO2 96%   BMI 26.04 kg/m  Gen:   Awake, no distress   Resp:  Normal effort  MSK:   Moves extremities without difficulty  Other:    Medical Decision Making  Medically screening exam initiated at 9:25 PM.  Appropriate orders placed.  Adam Brady was informed that the remainder of the evaluation will be completed by another provider, this initial triage assessment does not replace that evaluation, and the importance of remaining in the ED until their evaluation is complete.  Patient recently with holter monitor. Patient was called this afternoon and told to go to ED because his monitor showed multiple episodes of bradycardia and heart stopping for around 6 sec. Patient stating "I feel pretty good for a 89yo". Apparently patient may have had some slight weakness recently because family member stating that he has been walking with a stick recently.   Denies chest pain, SOB, nausea, vomiting, diarrhea.   Adam Brady, [place] 05/14/23 2127

## 2023-05-14 NOTE — Telephone Encounter (Signed)
Called [employer] and spoke with [facility] and she reported that the patient had a 6.2 second pause that resulted from a high AV block, which occurred on 04/25/23 at 11:25 pm and it can be found on page 18 strip # 5. He also had a run of 2nd degree AV block on 05/03/23 at 1:36 am. Spoke to Dr. Vincent Gros regarding  this information and he stated that if he is still having light headedness and dizzy episodes then he should go to the ER. Called the patient and informed them of Dr. Madireddy's recommendations and the patient stated that he was still getting light headed and having dizzy spells. Based on Dr. Madireddy's recommendation I informed them of the heart monitor results and explained that he should go to the ER to be evaluated. They were very hesitant to go to the ER and I explained that it was very important based on the findings of his heart monitor. They stated that they would call their daughter to drive them because they did not drive at night. They also stated that if they could not go to the ER tonight, they would go to the ER in the morning. I encouraged them to go to the ER as soon as possible to be evaluated. They verbalized understanding and had no further questions at this time.

## 2023-05-14 NOTE — Telephone Encounter (Signed)
Adam Brady from [facility] called to give abnormal results. The line was disconnected before I could reach out to triage.

## 2023-05-14 NOTE — ED Triage Notes (Signed)
Pt wore a heart monitor for 2 weeks and received a phone call tonight advising to come to the ER because the monitor showed multiple episodes of heart stopping for 6 seconds. Pt denies any chest pain or SOB. Pt states may feel a little different or weaker but no complaints

## 2023-05-15 ENCOUNTER — Inpatient Hospital Stay (HOSPITAL_COMMUNITY): Payer: Medicare HMO

## 2023-05-15 ENCOUNTER — Other Ambulatory Visit (HOSPITAL_COMMUNITY): Payer: Medicare HMO

## 2023-05-15 ENCOUNTER — Encounter (HOSPITAL_COMMUNITY): Payer: Self-pay | Admitting: Internal Medicine

## 2023-05-15 DIAGNOSIS — E785 Hyperlipidemia, unspecified: Secondary | ICD-10-CM | POA: Diagnosis not present

## 2023-05-15 DIAGNOSIS — I441 Atrioventricular block, second degree: Secondary | ICD-10-CM | POA: Diagnosis present

## 2023-05-15 DIAGNOSIS — Z823 Family history of stroke: Secondary | ICD-10-CM | POA: Diagnosis not present

## 2023-05-15 DIAGNOSIS — I459 Conduction disorder, unspecified: Principal | ICD-10-CM | POA: Diagnosis present

## 2023-05-15 DIAGNOSIS — Z8701 Personal history of pneumonia (recurrent): Secondary | ICD-10-CM | POA: Diagnosis not present

## 2023-05-15 DIAGNOSIS — I2510 Atherosclerotic heart disease of native coronary artery without angina pectoris: Secondary | ICD-10-CM | POA: Diagnosis present

## 2023-05-15 DIAGNOSIS — I700 Atherosclerosis of aorta: Secondary | ICD-10-CM | POA: Diagnosis present

## 2023-05-15 DIAGNOSIS — Z8616 Personal history of COVID-19: Secondary | ICD-10-CM | POA: Diagnosis not present

## 2023-05-15 DIAGNOSIS — I495 Sick sinus syndrome: Secondary | ICD-10-CM

## 2023-05-15 DIAGNOSIS — I493 Ventricular premature depolarization: Secondary | ICD-10-CM | POA: Diagnosis present

## 2023-05-15 DIAGNOSIS — R9431 Abnormal electrocardiogram [ECG] [EKG]: Secondary | ICD-10-CM | POA: Diagnosis not present

## 2023-05-15 DIAGNOSIS — Z66 Do not resuscitate: Secondary | ICD-10-CM | POA: Diagnosis present

## 2023-05-15 DIAGNOSIS — E875 Hyperkalemia: Secondary | ICD-10-CM | POA: Diagnosis present

## 2023-05-15 DIAGNOSIS — Z7984 Long term (current) use of oral hypoglycemic drugs: Secondary | ICD-10-CM | POA: Diagnosis not present

## 2023-05-15 DIAGNOSIS — Z87891 Personal history of nicotine dependence: Secondary | ICD-10-CM | POA: Diagnosis not present

## 2023-05-15 DIAGNOSIS — Z818 Family history of other mental and behavioral disorders: Secondary | ICD-10-CM | POA: Diagnosis not present

## 2023-05-15 DIAGNOSIS — K5909 Other constipation: Secondary | ICD-10-CM | POA: Diagnosis present

## 2023-05-15 DIAGNOSIS — Z8249 Family history of ischemic heart disease and other diseases of the circulatory system: Secondary | ICD-10-CM | POA: Diagnosis not present

## 2023-05-15 DIAGNOSIS — N400 Enlarged prostate without lower urinary tract symptoms: Secondary | ICD-10-CM | POA: Diagnosis present

## 2023-05-15 DIAGNOSIS — Z83438 Family history of other disorder of lipoprotein metabolism and other lipidemia: Secondary | ICD-10-CM | POA: Diagnosis not present

## 2023-05-15 DIAGNOSIS — E1169 Type 2 diabetes mellitus with other specified complication: Secondary | ICD-10-CM | POA: Diagnosis present

## 2023-05-15 DIAGNOSIS — E1122 Type 2 diabetes mellitus with diabetic chronic kidney disease: Secondary | ICD-10-CM | POA: Diagnosis present

## 2023-05-15 DIAGNOSIS — E7800 Pure hypercholesterolemia, unspecified: Secondary | ICD-10-CM | POA: Diagnosis present

## 2023-05-15 DIAGNOSIS — I471 Supraventricular tachycardia, unspecified: Secondary | ICD-10-CM | POA: Diagnosis present

## 2023-05-15 DIAGNOSIS — I129 Hypertensive chronic kidney disease with stage 1 through stage 4 chronic kidney disease, or unspecified chronic kidney disease: Secondary | ICD-10-CM | POA: Diagnosis present

## 2023-05-15 DIAGNOSIS — N1831 Chronic kidney disease, stage 3a: Secondary | ICD-10-CM | POA: Diagnosis present

## 2023-05-15 DIAGNOSIS — F05 Delirium due to known physiological condition: Secondary | ICD-10-CM | POA: Diagnosis not present

## 2023-05-15 LAB — BASIC METABOLIC PANEL
Anion gap: 12 (ref 5–15)
BUN: 10 mg/dL (ref 8–23)
CO2: 22 mmol/L (ref 22–32)
Calcium: 9.4 mg/dL (ref 8.9–10.3)
Chloride: 105 mmol/L (ref 98–111)
Creatinine, Ser: 0.95 mg/dL (ref 0.61–1.24)
GFR, Estimated: >60 mL/min (ref >60)
Glucose, Bld: 205 mg/dL — ABNORMAL HIGH (ref 70–99)
Potassium: 3.8 mmol/L (ref 3.5–5.1)
Sodium: 139 mmol/L (ref 135–145)

## 2023-05-15 LAB — HEPATIC FUNCTION PANEL
ALT: 17 U/L (ref 0–44)
AST: 23 U/L (ref 15–41)
Albumin: 3.5 g/dL (ref 3.5–5.0)
Alkaline Phosphatase: 87 U/L (ref 38–126)
Bilirubin, Direct: 0.2 mg/dL (ref 0.0–0.2)
Indirect Bilirubin: 1.2 mg/dL — ABNORMAL HIGH (ref 0.3–0.9)
Total Bilirubin: 1.4 mg/dL — ABNORMAL HIGH (ref 0.0–1.2)
Total Protein: 6.3 g/dL — ABNORMAL LOW (ref 6.5–8.1)

## 2023-05-15 LAB — ECHOCARDIOGRAM COMPLETE
AR max vel: 3.77 cm2
AV Peak grad: 4 mm[Hg]
Ao pk vel: 1.01 m/s
Calc EF: 56.7 %
Height: 72 in
S' Lateral: 3.4 cm
Single Plane A2C EF: 54.4 %
Single Plane A4C EF: 54.6 %
Weight: 3072.33 [oz_av]

## 2023-05-15 LAB — CBG MONITORING, ED
Glucose-Capillary: 266 mg/dL — ABNORMAL HIGH (ref 70–99)
Glucose-Capillary: 249 mg/dL — ABNORMAL HIGH (ref 70–99)

## 2023-05-15 LAB — T4, FREE: Free T4: 0.81 ng/dL (ref 0.61–1.12)

## 2023-05-15 LAB — HEMOGLOBIN A1C
Hgb A1c MFr Bld: 8.9 % — ABNORMAL HIGH (ref 4.8–5.6)
Mean Plasma Glucose: 208.73 mg/dL

## 2023-05-15 LAB — MAGNESIUM: Magnesium: 2.1 mg/dL (ref 1.7–2.4)

## 2023-05-15 LAB — TROPONIN I (HIGH SENSITIVITY): Troponin I (High Sensitivity): 6 ng/L (ref <18)

## 2023-05-15 LAB — PHOSPHORUS: Phosphorus: 3.3 mg/dL (ref 2.5–4.6)

## 2023-05-15 MED ORDER — LORAZEPAM 2 MG/ML IJ SOLN: 1.0000 mg | Freq: Four times a day (QID) | INTRAMUSCULAR | Status: DC | PRN

## 2023-05-15 MED ORDER — TAMSULOSIN HCL 0.4 MG PO CAPS
0.4000 mg | ORAL_CAPSULE | Freq: Every day | ORAL | Status: DC
  Administered 2023-05-15: 0.4 mg via ORAL
  Filled 2023-05-15: qty 1

## 2023-05-15 MED ORDER — INSULIN ASPART 100 UNIT/ML IJ SOLN
0.0000 [IU] | Freq: Three times a day (TID) | INTRAMUSCULAR | Status: DC
  Administered 2023-05-15: 3 [IU] via SUBCUTANEOUS

## 2023-05-15 MED ORDER — ACETAMINOPHEN 500 MG PO TABS: 1000.0000 mg | ORAL_TABLET | Freq: Four times a day (QID) | ORAL | Status: DC | PRN

## 2023-05-15 MED ORDER — SODIUM CHLORIDE 0.9% FLUSH: 3.0000 mL | Freq: Two times a day (BID) | INTRAVENOUS | Status: DC

## 2023-05-15 MED ORDER — DIPHENHYDRAMINE HCL 50 MG/ML IJ SOLN
50.0000 mg | INTRAMUSCULAR | Status: AC
  Administered 2023-05-15: 50 mg via INTRAVENOUS
  Filled 2023-05-15: qty 1

## 2023-05-15 MED ORDER — MELATONIN 3 MG PO TABS
6.0000 mg | ORAL_TABLET | Freq: Every evening | ORAL | Status: DC | PRN
  Filled 2023-05-15: qty 2

## 2023-05-15 MED ORDER — SIMVASTATIN 20 MG PO TABS
40.0000 mg | ORAL_TABLET | Freq: Every day | ORAL | Status: DC
  Administered 2023-05-15: 40 mg via ORAL
  Filled 2023-05-15: qty 2

## 2023-05-15 MED ORDER — POLYETHYLENE GLYCOL 3350 17 G PO PACK
17.0000 g | PACK | Freq: Every day | ORAL | Status: DC
  Administered 2023-05-15: 17 g via ORAL
  Filled 2023-05-15: qty 1

## 2023-05-15 MED ORDER — POLYETHYLENE GLYCOL 3350 17 G PO PACK: 17.0000 g | PACK | Freq: Every day | ORAL | Status: DC | PRN

## 2023-05-15 MED ORDER — LINACLOTIDE 145 MCG PO CAPS
290.0000 ug | ORAL_CAPSULE | Freq: Every day | ORAL | Status: DC
  Administered 2023-05-15: 290 ug via ORAL
  Filled 2023-05-15: qty 2

## 2023-05-15 MED ORDER — LORAZEPAM 2 MG/ML IJ SOLN
INTRAMUSCULAR | Status: AC
  Administered 2023-05-16: 1 mg via INTRAMUSCULAR
  Filled 2023-05-15: qty 1

## 2023-05-15 MED ORDER — MELATONIN 3 MG PO TABS: 6.0000 mg | ORAL_TABLET | Freq: Every day | ORAL | Status: DC

## 2023-05-15 MED ORDER — TRAMADOL HCL 50 MG PO TABS: 50.0000 mg | ORAL_TABLET | Freq: Three times a day (TID) | ORAL | Status: DC | PRN

## 2023-05-15 MED ORDER — FERROUS SULFATE 325 (65 FE) MG PO TABS: 325.0000 mg | ORAL_TABLET | Freq: Every day | ORAL | Status: DC

## 2023-05-15 MED ORDER — ALBUTEROL SULFATE (2.5 MG/3ML) 0.083% IN NEBU: 3.0000 mL | INHALATION_SOLUTION | RESPIRATORY_TRACT | Status: DC | PRN

## 2023-05-15 NOTE — ED Notes (Signed)
This nurse tech witnessed patient in bathroom with a rn and triage ems tech trying to assist him in chair to wheel him to room. Patient not cooperating and stating he does not need help. Patient is starting to get loud and aggressive with staff. His wife and daughter held each hand as I pushed wheel chair behind him with two nurses back to room. Once in room patient refuses to sit and starts pushing and trying to make staff leave. Security called and in room with him now

## 2023-05-15 NOTE — Progress Notes (Signed)
I have seen and assessed patient and agree with Dr. Lazarus Salines assessment and plan. Patient is a 89 year old gentleman history of hypertension, hyperlipidemia, diabetes, aortic atherosclerosis, aortic ectasia, frequent PACs/PVCs being evaluated with a ZIO monitor.  Patient noted to have been seen at outside hospital approximately month ago at random started on metoprolol 12.5 mg twice daily for PACs and PVCs.  Being followed by cardiology who had placed patient on a Zio patch was called to present to the hospital due to high degree heart block with a pause approximately 6 seconds.  Patient noted to have reported some episodes of dizziness about a week ago.  Patient admitted, beta-blocker discontinued and patient monitored on telemetry.  EP consulted for further evaluation and management.  No charge.

## 2023-05-15 NOTE — H&P (Signed)
History and Physical    Adam Brady DOB: 08/14/1933 DOA: 05/14/2023  PCP: Buckner Malta, MD   Patient coming from: Home   Chief Complaint:  Chief Complaint  Patient presents with   Bradycardia    HPI:  Adam Brady is a 89 y.o. male with hx of hypertension, hyperlipidemia, diabetes, aortic atherosclerosis by imaging, aortic ectasia, PAC/PVC.  He was seen in outside hospital approximately 1 month ago [facility] and was started on metoprolol 12.5 mg twice daily for PACs/PVCs.  He followed up with cardiologist who put him on a Zio patch and he was called into the hospital because he had high degree heart block with pause approximately 6 seconds.  Reports an episode of dizziness last week, 1/26.  Otherwise no syncope, presyncope.  No history of chest pain.  Reports decreased energy with activity especially over the past month.  Otherwise had a recent pneumonia was treated and symptoms had resolved.  No other recent illness.   Review of Systems:  ROS complete and negative except as marked above   Allergies  Allergen Reactions   Nitroglycerin     Reaction: hypotension    Prior to Admission medications   Medication Sig Start Date End Date Taking? Authorizing Provider  Apoaequorin (PREVAGEN PO) Take 1 tablet by mouth daily.    [provider]  Coenzyme Q10 200 MG capsule Take 200 mg by mouth daily.    [provider]  cyclobenzaprine (FLEXERIL) 5 MG tablet Take 5 mg by mouth at bedtime as needed for pain. 09/04/21   [provider]  ferrous sulfate 325 (65 FE) MG tablet Take 325 mg by mouth daily with breakfast.    [provider]  fluticasone (FLONASE) 50 MCG/ACT nasal spray Place 2 sprays into both nostrils daily as needed for allergies. 01/18/19   [provider]  GARLIC PO Take 9,562 mg by mouth daily.    [provider]  LINZESS 290 MCG CAPS capsule Take 290 mcg by mouth daily. 12/15/22   [provider]  Meclizine HCl 25 MG CHEW Chew 1 tablet by mouth 2 (two) times daily as needed for dizziness. 09/04/21   [provider]  metFORMIN (GLUCOPHAGE-XR) 500 MG 24 hr tablet Take 500 mg by mouth daily. 04/01/23   [provider]  metoprolol tartrate (LOPRESSOR) 25 MG tablet Take 0.5 tablets (12.5 mg total) by mouth 2 (two) times daily. 04/26/23   Flossie Dibble, NP  montelukast (SINGULAIR) 10 MG tablet Take 10 mg by mouth at bedtime.    [provider]  Multiple Vitamins-Minerals (MULTIVITAMIN/EXTRA VITAMIN D3 PO) Take 1 tablet by mouth daily.    [provider]  promethazine-dextromethorphan (PROMETHAZINE-DM) 6.25-15 MG/5ML syrup Take 5 mLs by mouth at bedtime as needed for cough. 03/26/23   [provider]  simvastatin (ZOCOR) 40 MG tablet Take 40 mg by mouth daily. 09/04/21   [provider]  tamsulosin (FLOMAX) 0.4 MG CAPS capsule Take 0.4 mg by mouth daily. 01/15/19   [provider]  traMADol (ULTRAM) 50 MG tablet Take 50 mg by mouth 3 (three) times daily as needed for pain. 12/22/18   [provider]    Past Medical History:  Diagnosis Date   Abnormal weight gain    Acute respiratory failure due to COVID-19 (HCC) 04/16/2019   Allergic rhinitis with postnasal drip    Atherosclerosis of aorta (HCC)    Atherosclerosis of native coronary artery without angina pectoris    BMI 28.0-28.9,adult  BMI 29.0-29.9,adult    Bradycardia 12/08/2021   Bradycardia, sinus    CAD (coronary artery disease) 04/16/2019   Calcification of artery    Cardiac murmur 12/08/2021   Carotid artery stenosis    Ceruminosis, bilateral    Chronic constipation    Dyspnea    ED (erectile dysfunction)    Eustachian tube dysfunction, bilateral    Fatigue    History of hiatal hernia    HLD (hyperlipidemia) 04/16/2019   HTN (hypertension) 04/16/2019   Mild hypercholesterolemia    Near syncope    Orthostatic hypotension    Pain and swelling of  left lower leg    Pneumonia due to COVID-19 virus 04/16/2019   Postoperative examination 05/11/2016   Ptosis of right upper eyelid 12/08/2021   Rickettsiosis, tick-borne    Stage 3a chronic kidney disease (CKD) (HCC)    Stage 3a chronic kidney disease (CKD) (HCC)    Thyroid nodule    Type 2 diabetes mellitus with hyperlipidemia (HCC)    Type 2 diabetes mellitus with hyperlipidemia (HCC)    Vertigo, labyrinthine     Past Surgical History:  Procedure Laterality Date   CARDIAC CATHETERIZATION     CHOLECYSTECTOMY, LAPAROSCOPIC     HERNIA REPAIR     X 3     reports that he has quit smoking. He has never used smokeless tobacco. He reports that he does not currently use alcohol. He reports that he does not currently use drugs.  Family History  Problem Relation Age of Onset   Heart failure Mother    Depression Mother    Psychosis Mother    Hypertension Father    Heart disease Father    CVA Father    Hypercholesterolemia Father      Physical Exam: Vitals:   05/14/23 2125 05/15/23 0049 05/15/23 0430 05/15/23 0645  BP:  113/62 120/75 120/85  Pulse:  68 (!) 30 86  Resp:  18 (!) 21 16  Temp:  97.6 F (36.4 C)    TempSrc:  Oral    SpO2:  95% 98% 95%  Weight: 87.1 kg     Height: 6' (1.829 m)       Gen: Awake, alert, NAD  HEENT: HOH   CV: Regular, normal S1, S2, no murmurs  Resp: Normal WOB, CTAB  Abd: Round, normoactive, nontender MSK: Symmetric, no edema  Skin: No rashes or lesions to exposed skin  Neuro: Alert and interactive  Psych: euthymic, appropriate    Data review:   Labs reviewed, notable for:   High-sensitivity troponin negative K5, Hyperglycemic T. bili 2.3 TSH 4.7  Micro:  Results for orders placed or performed during the hospital encounter of 04/16/19  Culture, Urine     Status: Abnormal   Collection Time: 04/17/19 11:10 AM   Specimen: Urine, Random  Result Value Ref Range Status   Specimen Description   Final    URINE, RANDOM Performed at  [facility], [address] [place], [place] [address]    Special Requests   Final    NONE Performed at [facility], [address], [place], [place] [address]    Culture (A)  Final    <10,000 COLONIES/mL INSIGNIFICANT GROWTH Performed at [facility], [address] [place], [place] [address]    Report Status 04/18/2019 FINAL  Final    Imaging reviewed:  DG Chest 2 View Result Date: 05/14/2023 CLINICAL DATA:  Chest pain EXAM: CHEST - 2 VIEW COMPARISON:  03/25/2023  FINDINGS: Minimal scarring at the left lung base. Lungs are otherwise clear. No pneumothorax or pleural effusion. Cardiac size is within normal limits. Thoracic aorta is tortuous and ectatic, unchanged. Pulmonary vascularity is normal. No acute bone abnormality. IMPRESSION: 1. No active cardiopulmonary disease. 2. Tortuous and ectatic thoracic aorta, unchanged. Electronically Signed   By: Helyn Numbers M.D.   On: 05/14/2023 22:27    EKG:  Sinus rhythm, LAFB, PRWP  ED Course:    Case was discussed with cardiology fellow who per report will arrange for EP to see in the morning.   Assessment/Plan:  89 y.o. male with hx hypertension, hyperlipidemia, diabetes, aortic atherosclerosis by imaging, aortic ectasia, PAC/PVC; recently started on metoprolol last month.  Called in due to abnormal Zio patch with pause up to 6 seconds.   Cardiac pause, High degree AV block  Per ED record: patient had a 6.2 second pause that resulted from a high AV block, which occurred on 04/25/23 at 11:25 pm and it can be found on page 18 strip # 5. He also had a run of 2nd degree AV block on 05/03/23 at 1:36 am.  Block likely related to beta-blocker therapy, may have intrinsic conduction system disease.  EKG here sinus rhythm with LAFB.  - Case was discussed with cardiology fellow who per report will arrange for EP to see in the morning. - Hold metoprolol - Check free T4; TSH high - Telemetry monitoring  Borderline  hyperkalemia, without AKI -Hold his home losartan -Low K diet  Isolated elevated T. bili - Repeat LFT  Incidental findings on imaging: Aortic ectasia: Outpatient surveillance  Chronic medical problems: Hypertension: Holding losartan due to borderline hyperkalemia Hyperlipidemia: Continue home simvastatin Diabetes type 2: Hold home metformin.  SSI while inpatient. Aortic atherosclerosis by imaging: Not on antiplatelet, statin per above Constipation: Continue home Linzess BPH: Continue home tamsulosin.  Body mass index is 26.04 kg/m.    DVT prophylaxis:  SCDs Code Status:  DNR/DNI(Do NOT Intubate); note he is unsure about his CODE STATUS.  Wishes default DNR/DNI and will revisit this  Diet:  Diet Orders (From admission, onward)     Start     Ordered   05/15/23 0517  Diet renal with fluid restriction Fluid restriction: 2000 mL Fluid; Room service appropriate? Yes; Fluid consistency: Thin  Diet effective now       Question Answer Comment  Fluid restriction: 2000 mL Fluid   Room service appropriate? Yes   Fluid consistency: Thin      05/15/23 0517           Family Communication: Yes discussed with his wife and daughter at the bedside.   Consults: Cardiology Admission status:   Inpatient, Telemetry bed  Severity of Illness: The appropriate patient status for this patient is INPATIENT. Inpatient status is judged to be reasonable and necessary in order to provide the required intensity of service to ensure the patient's safety. The patient's presenting symptoms, physical exam findings, and initial radiographic and laboratory data in the context of their chronic comorbidities is felt to place them at high risk for further clinical deterioration. Furthermore, it is not anticipated that the patient will be medically stable for discharge from the hospital within 2 midnights of admission.   * I certify that at the point of admission it is my clinical judgment that the patient will  require inpatient hospital care spanning beyond 2 midnights from the point of admission due to high intensity of service, high risk for further  deterioration and high frequency of surveillance required.*   Dolly Rias, MD [facility]  How to contact the [facility] Attending or Consulting provider 7A - 7P or covering provider during after hours 7P -7A, for this patient.  Check the care team in [facility] and look for a) attending/consulting [facility] provider listed and b) the [facility] team listed Log into [email or web address] and use [place]'s universal password to access. If you do not have the password, please contact the hospital operator. Locate the [facility] provider you are looking for under [facility] and page to a number that you can be directly reached. If you still have difficulty reaching the provider, please page the [facility] (Director on Call) for the Hospitalists listed on amion for assistance.  05/15/2023, 7:23 AM

## 2023-05-15 NOTE — ED Provider Notes (Signed)
[place] EMERGENCY DEPARTMENT AT [facility] Provider Note   CSN: [id number] Arrival date & time: 05/14/23  2109     History  Chief Complaint  Patient presents with   Bradycardia    Adam Brady is a 89 y.o. male.  Patient presents to the emergency department at the request of his cardiologist.  Patient was seen at the emergency department in [place] about a month ago with palpitations and was told he had PACs and PVCs.  Metoprolol was started 12.5 mg twice a day.  He followed up with cardiology and had a Zio patch placed.  He was called tonight because the monitoring company contacted the cardiology group and reported that he was having pauses and heartbeat.  He has had some intermittent episodes of dizziness, no syncope.  No chest pain.       Home Medications Prior to Admission medications   Medication Sig Start Date End Date Taking? Authorizing Provider  Apoaequorin (PREVAGEN PO) Take 1 tablet by mouth daily.    [provider]  Coenzyme Q10 200 MG capsule Take 200 mg by mouth daily.    [provider]  cyclobenzaprine (FLEXERIL) 5 MG tablet Take 5 mg by mouth at bedtime as needed for pain. 09/04/21   [provider]  ferrous sulfate 325 (65 FE) MG tablet Take 325 mg by mouth daily with breakfast.    [provider]  fluticasone (FLONASE) 50 MCG/ACT nasal spray Place 2 sprays into both nostrils daily as needed for allergies. 01/18/19   [provider]  GARLIC PO Take 1,914 mg by mouth daily.    [provider]  LINZESS 290 MCG CAPS capsule Take 290 mcg by mouth daily. 12/15/22   [provider]  Meclizine HCl 25 MG CHEW Chew 1 tablet by mouth 2 (two) times daily as needed for dizziness. 09/04/21   [provider]  metFORMIN (GLUCOPHAGE-XR) 500 MG 24 hr tablet Take 500 mg by mouth daily. 04/01/23   [provider]  metoprolol tartrate (LOPRESSOR) 25 MG tablet Take 0.5 tablets (12.5 mg total)  by mouth 2 (two) times daily. 04/26/23   Flossie Dibble, NP  montelukast (SINGULAIR) 10 MG tablet Take 10 mg by mouth at bedtime.    [provider]  Multiple Vitamins-Minerals (MULTIVITAMIN/EXTRA VITAMIN D3 PO) Take 1 tablet by mouth daily.    [provider]  promethazine-dextromethorphan (PROMETHAZINE-DM) 6.25-15 MG/5ML syrup Take 5 mLs by mouth at bedtime as needed for cough. 03/26/23   [provider]  simvastatin (ZOCOR) 40 MG tablet Take 40 mg by mouth daily. 09/04/21   [provider]  tamsulosin (FLOMAX) 0.4 MG CAPS capsule Take 0.4 mg by mouth daily. 01/15/19   [provider]  traMADol (ULTRAM) 50 MG tablet Take 50 mg by mouth 3 (three) times daily as needed for pain. 12/22/18   [provider]      Allergies    Nitroglycerin    Review of Systems   Review of Systems  Physical Exam Updated Vital Signs BP 113/62 (BP Location: Right Arm)   Pulse 68   Temp 97.6 F (36.4 C) (Oral)   Resp 18   Ht 6' (1.829 m)   Wt 87.1 kg   SpO2 95%   BMI 26.04 kg/m  Physical Exam Vitals and nursing note reviewed.  Constitutional:      General: He is not in acute distress.    Appearance: He is well-developed.  HENT:     Head:  Normocephalic and atraumatic.     Mouth/Throat:     Mouth: Mucous membranes are moist.  Eyes:     General: Vision grossly intact. Gaze aligned appropriately.     Extraocular Movements: Extraocular movements intact.     Conjunctiva/sclera: Conjunctivae normal.  Cardiovascular:     Rate and Rhythm: Normal rate and regular rhythm.     Pulses: Normal pulses.     Heart sounds: Normal heart sounds, S1 normal and S2 normal. No murmur heard.    No friction rub. No gallop.  Pulmonary:     Effort: Pulmonary effort is normal. No respiratory distress.     Breath sounds: Normal breath sounds.  Abdominal:     Palpations: Abdomen is soft.     Tenderness: There is no abdominal tenderness. There is no guarding or rebound.      Hernia: No hernia is present.  Musculoskeletal:        General: No swelling.     Cervical back: Full passive range of motion without pain, normal range of motion and neck supple. No pain with movement, spinous process tenderness or muscular tenderness. Normal range of motion.     Right lower leg: No edema.     Left lower leg: No edema.  Skin:    General: Skin is warm and dry.     Capillary Refill: Capillary refill takes less than 2 seconds.     Findings: No ecchymosis, erythema, lesion or wound.  Neurological:     Mental Status: He is alert and oriented to person, place, and time.     GCS: GCS eye subscore is 4. GCS verbal subscore is 5. GCS motor subscore is 6.     Cranial Nerves: Cranial nerves 2-12 are intact.     Sensory: Sensation is intact.     Motor: Motor function is intact. No weakness or abnormal muscle tone.     Coordination: Coordination is intact.  Psychiatric:        Mood and Affect: Mood normal.        Speech: Speech normal.        Behavior: Behavior normal.     ED Results / Procedures / Treatments   Labs (all labs ordered are listed, but only abnormal results are displayed) Labs Reviewed  COMPREHENSIVE METABOLIC PANEL - Abnormal; Notable for the following components:      Result Value   Sodium 134 (*)    Glucose, Bld 239 (*)    Total Protein 6.3 (*)    AST 42 (*)    Total Bilirubin 2.3 (*)    All other components within normal limits  TSH - Abnormal; Notable for the following components:   TSH 4.735 (*)    All other components within normal limits  CBC WITH DIFFERENTIAL/PLATELET  MAGNESIUM  TROPONIN I (HIGH SENSITIVITY)  TROPONIN I (HIGH SENSITIVITY)    EKG EKG Interpretation Date/Time:  Friday May 14 2023 21:26:37 EST Ventricular Rate:  71 PR Interval:  188 QRS Duration:  104 QT Interval:  414 QTC Calculation: 449 R Axis:   -64  Text Interpretation: Normal sinus rhythm Left anterior fascicular block Abnormal ECG When compared with ECG  of 23-Apr-2023 11:28, No acute changes Confirmed by Gilda Crease [phone]) on 05/15/2023 2:01:35 AM  Radiology DG Chest 2 View Result Date: 05/14/2023 CLINICAL DATA:  Chest pain EXAM: CHEST - 2 VIEW COMPARISON:  03/25/2023 FINDINGS: Minimal scarring at the left lung base. Lungs are otherwise clear. No pneumothorax or pleural effusion. Cardiac size  is within normal limits. Thoracic aorta is tortuous and ectatic, unchanged. Pulmonary vascularity is normal. No acute bone abnormality. IMPRESSION: 1. No active cardiopulmonary disease. 2. Tortuous and ectatic thoracic aorta, unchanged. Electronically Signed   By: Helyn Numbers M.D.   On: 05/14/2023 22:27    Procedures Procedures    Medications Ordered in ED Medications - No data to display  ED Course/ Medical Decision Making/ A&P                                 Medical Decision Making Irhythm:  patient had a 6.2 second pause that resulted from a high AV block, which occurred on 04/25/23 at 11:25 pm and it can be found on page 18 strip # 5. He also had a run of 2nd degree AV block on 05/03/23 at 1:36 am.   Patient appears well here in the ED.  EKG is sinus rhythm.  Patient reportedly had significant pauses with high-grade block caught on his ZIO patch.  This may be secondary to recently starting the metoprolol for his palpitations.  Discussed with Dr. Derrell Lolling, on-call for cardiology.  Agrees with hospitalist observation, hold metoprolol and EP will see the patient in the morning.        Final Clinical Impression(s) / ED Diagnoses Final diagnoses:  Heart block    Rx / DC Orders ED Discharge Orders     None         Zelig Gacek, Canary Brim, MD 05/15/23 [phone]

## 2023-05-15 NOTE — Progress Notes (Addendum)
Patient is RN reported that patient is very agitated almost hitting the nursing staff and patient's wife.  Per chart review patient has been admitted for workup for high-grade AV block. Cardiology evaluated patient concern for tachybradycardia syndrome, frequent PAC and PVC and SVT.  Cardiology recommended permanent pacemaker  however patient stated that he would like to think about it.    -Due to persistent agitation starting Ativan 1 mg every 6 hour as needed for anxiety.  Continue delirium precaution.  Continue fall precaution.  -RN reported that patient is persistently agitated.  Will have to give another dose of Ativan 1 and Benadryl.  Unable to give oral Zyprexa.  Even with the Ativan and Benadryl x 2 doses patient is still agitated.  Implemented bilateral wrist soft restraints.   Tereasa Coop, MD [facility] 05/16/2023, 12:28 AM

## 2023-05-15 NOTE — Progress Notes (Signed)
Echocardiogram 2D Echocardiogram has been performed.  Azadeh Hyder N Terrill Wauters,RDCS 05/15/2023, 2:54 PM

## 2023-05-15 NOTE — Consult Note (Signed)
Cardiology Consultation   Patient ID: Adam Brady MRN: [id number]; DOB: 09-Sep-1933  Admit date: 05/14/2023 Date of Consult: 05/15/2023  PCP:  Buckner Malta, MD   [facility] Providers Cardiologist:  Garwin Brothers, MD        HPI:   Adam Brady is a 89 y.o. male with a hx of hypertension, hyperlipidemia, diabetes, aortic atherosclerosis by imaging, aortic ectasia, very frequent PAC/PVCs who was told to come to the emergency room for evaluation after having a pause on a ZIO monitor.  Patient reports that he was sleeping during these episodes and was not aware of them.  Zio monitor had been ordered by a cardiologist after he was started on metoprolol for frequent PACs and PVCs during an emergency room visit.  Patient reports occasional dizzy episodes at home but has not lost consciousness or fallen.  He also reports occasional palpitations but does not feel like they are significantly distressing to him.  Currently, he reports feeling relatively well and has no new or acute complaints.  Past Medical History:  Diagnosis Date   Abnormal weight gain    Acute respiratory failure due to COVID-19 (HCC) 04/16/2019   Allergic rhinitis with postnasal drip    Atherosclerosis of aorta (HCC)    Atherosclerosis of native coronary artery without angina pectoris    BMI 28.0-28.9,adult    BMI 29.0-29.9,adult    Bradycardia 12/08/2021   Bradycardia, sinus    CAD (coronary artery disease) 04/16/2019   Calcification of artery    Cardiac murmur 12/08/2021   Carotid artery stenosis    Ceruminosis, bilateral    Chronic constipation    Dyspnea    ED (erectile dysfunction)    Eustachian tube dysfunction, bilateral    Fatigue    History of hiatal hernia    HLD (hyperlipidemia) 04/16/2019   HTN (hypertension) 04/16/2019   Mild hypercholesterolemia    Near syncope    Orthostatic hypotension    Pain and swelling of left lower leg    Pneumonia due to COVID-19 virus 04/16/2019    Postoperative examination 05/11/2016   Ptosis of right upper eyelid 12/08/2021   Rickettsiosis, tick-borne    Stage 3a chronic kidney disease (CKD) (HCC)    Stage 3a chronic kidney disease (CKD) (HCC)    Thyroid nodule    Type 2 diabetes mellitus with hyperlipidemia (HCC)    Type 2 diabetes mellitus with hyperlipidemia (HCC)    Vertigo, labyrinthine     Past Surgical History:  Procedure Laterality Date   CARDIAC CATHETERIZATION     CHOLECYSTECTOMY, LAPAROSCOPIC     HERNIA REPAIR     X 3     Inpatient Medications: Scheduled Meds:  insulin aspart  0-6 Units Subcutaneous TID WC   linaclotide  290 mcg Oral Daily   simvastatin  40 mg Oral Daily   sodium chloride flush  3 mL Intravenous Q12H   tamsulosin  0.4 mg Oral Daily    PRN Meds: acetaminophen, albuterol, melatonin, polyethylene glycol  Allergies:    Allergies  Allergen Reactions   Nitroglycerin     Reaction: hypotension    Social History:   Social History   Socioeconomic History   Marital status: Married    Spouse name: Betsie   Number of children: Not on file   Years of education: Not on file   Highest education level: Not on file  Occupational History   Not on file  Tobacco Use   Smoking status: Former   Smokeless tobacco: Never  Vaping Use   Vaping status: Never Used  Substance and Sexual Activity   Alcohol use: Not Currently   Drug use: Not Currently   Sexual activity: Not on file  Other Topics Concern   Not on file  Social History Narrative   Not on file   Social Drivers of Health   Financial Resource Strain: Not on file  Food Insecurity: Not on file  Transportation Needs: Not on file  Physical Activity: Not on file  Stress: Not on file  Social Connections: Not on file  Intimate Partner Violence: Not on file    Family History:   Family History  Problem Relation Age of Onset   Heart failure Mother    Depression Mother    Psychosis Mother    Hypertension Father    Heart disease  Father    CVA Father    Hypercholesterolemia Father      ROS:  Please see the history of present illness.   All other ROS reviewed and negative.     Physical Exam/Data:   Vitals:   05/15/23 0645 05/15/23 0939 05/15/23 0945 05/15/23 1100  BP: 120/85 (!) 141/57  136/73  Pulse: 86 62    Resp: 16 19  17   Temp:   98.2 F (36.8 C)   TempSrc:   Oral   SpO2: 95% 97%  96%  Weight:      Height:       No intake or output data in the 24 hours ending 05/15/23 1230    05/14/2023    9:25 PM 04/23/2023   11:22 AM 12/08/2021   11:04 AM  Last 3 Weights  Weight (lbs) 192 lb 0.3 oz 192 lb 3.2 oz 198 lb 9.6 oz  Weight (kg) 87.1 kg 87.181 kg 90.084 kg     Body mass index is 26.04 kg/m.  General:  Well nourished, well developed, in no acute distress HEENT: normal Neck: no JVD Vascular: No carotid bruits; Distal pulses 2+ bilaterally Cardiac: Normal rate, irregular rhythm Lungs: Normal work of breathing Ext: no edema Neuro:  CNs 2-12 intact, no focal abnormalities noted Psych:  Normal affect   EKG:  The EKG was personally reviewed and demonstrates: Sinus rhythm Telemetry:  Telemetry was personally reviewed and demonstrates: Sinus rhythm with frequent PACs and PVCs.  Relevant CV Studies: Echo 01/06/2022: 1. Left ventricular ejection fraction, by estimation, is 55 to 60%. The  left ventricle has normal function. The left ventricle has no regional  wall motion abnormalities. Left ventricular diastolic parameters are  consistent with Grade I diastolic  dysfunction (impaired relaxation).   2. The aortic valve is normal in structure. Aortic valve regurgitation is  mild. No aortic stenosis is present.   Laboratory Data:  High Sensitivity Troponin:   Recent Labs  Lab 05/14/23 2138 05/14/23 2339  TROPONINIHS 6 6     Chemistry Recent Labs  Lab 05/14/23 2138 05/15/23 0638  NA 134* 139  K 5.0 3.8  CL 101 105  CO2 23 22  GLUCOSE 239* 205*  BUN 13 10  CREATININE 0.93 0.95   CALCIUM 9.3 9.4  MG 2.2 2.1  GFRNONAA >60 >60  ANIONGAP 10 12    Recent Labs  Lab 05/14/23 2138 05/15/23 0638  PROT 6.3* 6.3*  ALBUMIN 3.6 3.5  AST 42* 23  ALT 13 17  ALKPHOS 106 87  BILITOT 2.3* 1.4*   Lipids No results for input(s): "CHOL", "TRIG", "HDL", "LABVLDL", "LDLCALC", "CHOLHDL" in the last 168 hours.  Hematology Recent  Labs  Lab 05/14/23 2138  WBC 8.3  RBC 4.87  HGB 16.0  HCT 46.9  MCV 96.3  MCH 32.9  MCHC 34.1  RDW 13.2  PLT 173   Thyroid  Recent Labs  Lab 05/14/23 2138 05/15/23 0638  TSH 4.735*  --   FREET4  --  0.81    BNPNo results for input(s): "BNP", "PROBNP" in the last 168 hours.  DDimer No results for input(s): "DDIMER" in the last 168 hours.   Radiology/Studies:  DG Chest 2 View Result Date: 05/14/2023 CLINICAL DATA:  Chest pain EXAM: CHEST - 2 VIEW COMPARISON:  03/25/2023 FINDINGS: Minimal scarring at the left lung base. Lungs are otherwise clear. No pneumothorax or pleural effusion. Cardiac size is within normal limits. Thoracic aorta is tortuous and ectatic, unchanged. Pulmonary vascularity is normal. No acute bone abnormality. IMPRESSION: 1. No active cardiopulmonary disease. 2. Tortuous and ectatic thoracic aorta, unchanged. Electronically Signed   By: Helyn Numbers M.D.   On: 05/14/2023 22:27     Assessment and Plan:  Adam Brady is a 89 y.o. male with a hx of hypertension, hyperlipidemia, diabetes, aortic atherosclerosis by imaging, aortic ectasia, very frequent PAC/PVCs who is being evaluated for abnormal ZIO monitor.  Patient was found to have an irregular rhythm by his PCP was sent to the ED for evaluation where he was found to have frequent PACs and PVCs.  He was started on metoprolol tartrate 12.5 mg twice daily.  Zio monitor was ordered which showed multiple episodes of AV block, almost all of which appeared to be occurring during sleep.  Zio monitor was also notable for 537 episodes of SVT, longest lasting 39 seconds, and 20%  PAC and 4% PVC burden.  He does report some dizziness and palpitations at home.  He meets criteria for pacemaker implant due to presence of tachybradycardia syndrome should he choose to do so.  # Tachycardia-bradycardia syndrome # Frequent PACs # Frequent PVCs # SVT -Permanent pacemaker was offered.  Risk and benefits of the procedure were discussed extensively with the patient and his family.  The patient would like to think about whether or not pacemaker implant would be aligned with his goals of care. -We will stop metoprolol for now and monitor on telemetry for at least 24 hours. -While here we will obtain an updated echocardiogram.  For questions or updates, please contact [facility] Please consult [email or web address] for contact info under    Signed, Nobie Putnam, MD  05/15/2023 12:30 PM

## 2023-05-16 DIAGNOSIS — I459 Conduction disorder, unspecified: Secondary | ICD-10-CM | POA: Diagnosis not present

## 2023-05-16 DIAGNOSIS — K5909 Other constipation: Secondary | ICD-10-CM

## 2023-05-16 DIAGNOSIS — E785 Hyperlipidemia, unspecified: Secondary | ICD-10-CM | POA: Diagnosis not present

## 2023-05-16 DIAGNOSIS — E1169 Type 2 diabetes mellitus with other specified complication: Secondary | ICD-10-CM | POA: Diagnosis not present

## 2023-05-16 DIAGNOSIS — N400 Enlarged prostate without lower urinary tract symptoms: Secondary | ICD-10-CM

## 2023-05-16 LAB — COMPREHENSIVE METABOLIC PANEL
ALT: 13 U/L (ref 0–44)
AST: 19 U/L (ref 15–41)
Albumin: 3.3 g/dL — ABNORMAL LOW (ref 3.5–5.0)
Alkaline Phosphatase: 81 U/L (ref 38–126)
Anion gap: 7 (ref 5–15)
BUN: 10 mg/dL (ref 8–23)
CO2: 24 mmol/L (ref 22–32)
Calcium: 9.2 mg/dL (ref 8.9–10.3)
Chloride: 108 mmol/L (ref 98–111)
Creatinine, Ser: 0.85 mg/dL (ref 0.61–1.24)
GFR, Estimated: >60 mL/min (ref >60)
Glucose, Bld: 207 mg/dL — ABNORMAL HIGH (ref 70–99)
Potassium: 3.9 mmol/L (ref 3.5–5.1)
Sodium: 139 mmol/L (ref 135–145)
Total Bilirubin: 1.5 mg/dL — ABNORMAL HIGH (ref 0.0–1.2)
Total Protein: 5.8 g/dL — ABNORMAL LOW (ref 6.5–8.1)

## 2023-05-16 LAB — CBC
HCT: 45.9 % (ref 39.0–52.0)
Hemoglobin: 15.5 g/dL (ref 13.0–17.0)
MCH: 32.6 pg (ref 26.0–34.0)
MCHC: 33.8 g/dL (ref 30.0–36.0)
MCV: 96.4 fL (ref 80.0–100.0)
Platelets: 141 10*3/uL — ABNORMAL LOW (ref 150–400)
RBC: 4.76 MIL/uL (ref 4.22–5.81)
RDW: 13.2 % (ref 11.5–15.5)
WBC: 8.6 10*3/uL (ref 4.0–10.5)
nRBC: 0 % (ref 0.0–0.2)

## 2023-05-16 LAB — CBG MONITORING, ED
Glucose-Capillary: 227 mg/dL — ABNORMAL HIGH (ref 70–99)
Glucose-Capillary: 210 mg/dL — ABNORMAL HIGH (ref 70–99)

## 2023-05-16 LAB — VITAMIN B12: Vitamin B-12: 239 pg/mL (ref 180–914)

## 2023-05-16 LAB — AMMONIA: Ammonia: 35 umol/L (ref 9–35)

## 2023-05-16 LAB — TSH: TSH: 4.634 u[IU]/mL — ABNORMAL HIGH (ref 0.350–4.500)

## 2023-05-16 MED ORDER — LORAZEPAM 2 MG/ML IJ SOLN
2.0000 mg | Freq: Once | INTRAMUSCULAR | Status: AC
  Administered 2023-05-16: 2 mg via INTRAVENOUS
  Filled 2023-05-16: qty 1

## 2023-05-16 MED ORDER — HYDROMORPHONE HCL 1 MG/ML IJ SOLN: 1.0000 mg | INTRAMUSCULAR | Status: DC

## 2023-05-16 MED ORDER — HALOPERIDOL LACTATE 5 MG/ML IJ SOLN: 2.0000 mg | Freq: Four times a day (QID) | INTRAMUSCULAR | Status: DC | PRN

## 2023-05-16 MED ORDER — LORAZEPAM 2 MG/ML IJ SOLN: 1.0000 mg | INTRAMUSCULAR | Status: DC | PRN

## 2023-05-16 MED ORDER — LORAZEPAM 2 MG/ML IJ SOLN
1.0000 mg | INTRAMUSCULAR | Status: AC
  Administered 2023-05-16: 1 mg via INTRAVENOUS
  Filled 2023-05-16: qty 1

## 2023-05-16 MED ORDER — QUETIAPINE FUMARATE 25 MG PO TABS
25.0000 mg | ORAL_TABLET | Freq: Every day | ORAL | Status: DC
Start: 2023-05-16 — End: 2023-05-16

## 2023-05-16 MED ORDER — HYDROMORPHONE HCL 1 MG/ML IJ SOLN: 2.0000 mg | INTRAMUSCULAR | Status: DC

## 2023-05-16 MED ORDER — HYDROMORPHONE HCL 1 MG/ML IJ SOLN
INTRAMUSCULAR | Status: AC
  Filled 2023-05-16: qty 1

## 2023-05-16 MED ORDER — DIPHENHYDRAMINE HCL 50 MG/ML IJ SOLN
50.0000 mg | INTRAMUSCULAR | Status: AC
  Administered 2023-05-16: 50 mg via INTRAVENOUS
  Filled 2023-05-16: qty 1

## 2023-05-16 NOTE — Discharge Summary (Signed)
Physician Discharge Summary  Adam Brady ZOX:[id number] DOB: 21-Jul-1933 DOA: 05/14/2023  PCP: Buckner Malta, MD  Admit date: 05/14/2023 Discharge date: 05/16/2023  Time spent: 60 minutes  Recommendations for Outpatient Follow-up:  Follow-up with Dr. Nobie Putnam, EP/cardiology on 05/18/2023 for pacemaker implantation. Follow-up with Buckner Malta, MD in 2 weeks.  On follow-up patient need a comprehensive metabolic profile done to follow-up on electrolytes and renal function and LFTs.  Chronic medical issues and need to be followed up upon.Marland Kitchen   Discharge Diagnoses:  Principal Problem:   Heart block Active Problems:   HTN (hypertension)   HLD (hyperlipidemia)   Chronic constipation   Type 2 diabetes mellitus with hyperlipidemia (HCC)   Benign prostatic hyperplasia   Discharge Condition: Stable  Diet recommendation: Heart healthy  Filed Weights   05/14/23 2125  Weight: 87.1 kg    History of present illness:  HPI per Dr. Dorris Singh Adam Brady is a 89 y.o. male with hx of hypertension, hyperlipidemia, diabetes, aortic atherosclerosis by imaging, aortic ectasia, PAC/PVC.  He was seen in outside hospital approximately 1 month ago [facility] and was started on metoprolol 12.5 mg twice daily for PACs/PVCs.  He followed up with cardiologist who put him on a Zio patch and he was called into the hospital because he had high degree heart block with pause approximately 6 seconds.  Reports an episode of dizziness last week, 1/26.  Otherwise no syncope, presyncope.  No history of chest pain.  Reports decreased energy with activity especially over the past month.  Otherwise had a recent pneumonia was treated and symptoms had resolved.  No other recent illness. ]  Hospital Course:  #1 high degree AV block/cardiac pause/tachybradycardia syndrome -Per ED record patient noted to had a 6.2-second pause that resulted from the high AV block which occurred on 04/25/2023 at 11:25 PM noted to be on  page 18 strip #5. -Patient also noted to have had a run of second-degree AV block on 05/03/2023 at 1:36 AM. -Blood felt likely related to beta-blocker therapy which may have an intrinsic conduction system disease. -EKG done on presentation with normal sinus rhythm with LAFB. -Free T4 was obtained which was within normal limits, TSH noted to be slightly elevated at 4.634. -Patient's metoprolol was held and patient monitored on telemetry. -Patient seen in consultation by cardiology/EP who reviewed telemetry, patient noted to have a history of tachybradycardia syndrome, frequent PACs, frequent PVCs, SVT and pacemaker offered. -Patient monitored overnight on telemetry and cardiology planning tentatively for pacemaker implant on Tuesday in the outpatient setting. -Per cardiology.  2.  Agitation/delirium Patient noted overnight during the hospitalization-to be very agitated and delirious felt likely secondary to sundowning versus hospital delirium. -Patient afebrile, no signs or symptoms of infection, chest x-ray unremarkable. -Patient with some improvement with agitation. -Outpatient follow-up with PCP.  3.  Borderline hyperkalemia -ARB was held during the hospitalization will be resumed on discharge.  4.  Incidental findings of aortic ectasia on imaging -Outpatient surveillance.  5.  Hypertension -Losartan held during the hospitalization will be resumed on discharge.  6.  Hyperlipidemia -Patient maintained on home regimen statin.  7.  Diabetes mellitus type 2 -Patient's oral hypoglycemic agents of metformin were held and patient maintained on sliding scale insulin during the hospitalization.  8.  Constipation -Patient maintained on home regimen MiraLAX and Linzess.  9.  BPH -Patient maintained on home regimen tamsulosin.  Procedures: 2D echo 05/15/2023 Chest x-ray 05/14/2023   Consultations: Cardiology/EP: Dr. Nobie Putnam 05/15/2023  Discharge Exam: Vitals:  05/16/23 0830  05/16/23 0900  BP: 107/65 130/87  Pulse: (!) 56 86  Resp: 16 (!) 22  Temp:    SpO2: 96% 99%    General: NAD.  Sleeping. Cardiovascular: RRR no murmurs rubs or gallops.  No JVD.  No pitting lower extremity edema. Respiratory: Clear to auscultation bilaterally anterior lung fields.  No wheezes, no crackles, no rhonchi.  Fair air movement.   Discharge Instructions   Discharge Instructions     Diet - low sodium heart healthy   Complete by: As directed    Increase activity slowly   Complete by: As directed       Allergies as of 05/16/2023       Reactions   Nitroglycerin    Reaction: hypotension        Medication List     STOP taking these medications    metoprolol tartrate 25 MG tablet Commonly known as: LOPRESSOR       TAKE these medications    albuterol 108 (90 Base) MCG/ACT inhaler Commonly known as: VENTOLIN HFA Inhale 2 puffs into the lungs 2 (two) times daily as needed for wheezing or shortness of breath.   aspirin 500 MG EC tablet Take 500 mg by mouth as needed for pain.   Coenzyme Q10 200 MG capsule Take 200 mg by mouth daily.   cyclobenzaprine 5 MG tablet Commonly known as: FLEXERIL Take 5 mg by mouth at bedtime as needed for pain.   ferrous sulfate 325 (65 FE) MG tablet Take 325 mg by mouth daily with breakfast.   FIBER ADULT GUMMIES PO Take 7 g by mouth daily.   GARLIC PO Take 1,610 mg by mouth daily.   Linzess 290 MCG Caps capsule Generic drug: linaclotide Take 290 mcg by mouth daily.   Meclizine HCl 25 MG Chew Chew 1 tablet by mouth 2 (two) times daily as needed for dizziness.   metFORMIN 500 MG 24 hr tablet Commonly known as: GLUCOPHAGE-XR Take 500 mg by mouth daily after supper.   MULTIVITAMIN/EXTRA VITAMIN D3 PO Take 1 tablet by mouth daily.   polyethylene glycol 17 g packet Commonly known as: MIRALAX / GLYCOLAX Take 17 g by mouth daily. Take 17grams if a bowel movement is not achieved by 1400 daily   PREVAGEN PO Take 1  tablet by mouth daily.   simvastatin 40 MG tablet Commonly known as: ZOCOR Take 40 mg by mouth at bedtime.   tamsulosin 0.4 MG Caps capsule Commonly known as: FLOMAX Take 0.4 mg by mouth daily.   traMADol 50 MG tablet Commonly known as: ULTRAM Take 50 mg by mouth 3 (three) times daily as needed for pain.       Allergies  Allergen Reactions   Nitroglycerin     Reaction: hypotension    Follow-up Information     Buckner Malta, MD. Schedule an appointment as soon as possible for a visit in 2 week(s).   Specialty: Family Medicine Contact information: [address] [place] [address] [phone]         Nobie Putnam, MD Follow up in 2 day(s).   Specialties: Cardiology, Radiology Why: Office will call with appointment time. Contact information: [address] [place] [address] [phone]                  The results of significant diagnostics from this hospitalization (including imaging, microbiology, ancillary and laboratory) are listed below for reference.    Significant Diagnostic Studies: ECHOCARDIOGRAM COMPLETE Result  Date: 05/15/2023    ECHOCARDIOGRAM REPORT   Patient Name:   SAMEL Mesquita Date of Exam: 05/15/2023 Medical Rec #:  [id number]      Height:       72.0 in Accession #:    [id number]     Weight:       192.0 lb Date of Birth:  [birth year]     BSA:          2.094 m Patient Age:    89 years       BP:           136/73 mmHg Patient Gender: M              HR:           104 bpm. Exam Location:  Inpatient Procedure: 2D Echo, Color Doppler and Cardiac Doppler Indications:    Abnormal ECG  History:        Patient has prior history of Echocardiogram examinations, most                 recent 12/17/2021. CAD, Arrythmias:Bradycardia and Atrial                 Fibrillation, Signs/Symptoms:Murmur, Syncope and Fatigue; Risk                 Factors:Hypertension, Dyslipidemia, Former Smoker and Diabetes.  Sonographer:    Raeford Razor RDCS  Referring Phys: [id number] JOSHUA PARKER IMPRESSIONS  1. Left ventricular ejection fraction, by estimation, is 50 to 55%. Left ventricular ejection fraction by PLAX is 54 %. The left ventricle has low normal function. The left ventricle demonstrates global hypokinesis. Left ventricular diastolic function could not be evaluated.  2. Right ventricular systolic function is normal. The right ventricular size is normal. Tricuspid regurgitation signal is inadequate for assessing PA pressure.  3. Right atrial size was mildly dilated.  4. The mitral valve is grossly normal. Trivial mitral valve regurgitation.  5. The aortic valve is tricuspid. Aortic valve regurgitation is trivial. Aortic valve sclerosis/calcification is present, without any evidence of aortic stenosis.  6. The inferior vena cava is dilated in size with >50% respiratory variability, suggesting right atrial pressure of 8 mmHg. Comparison(s): Changes from prior study are noted. 12/17/2021: LVEF 55-60%, mild AI. FINDINGS  Left Ventricle: Left ventricular ejection fraction, by estimation, is 50 to 55%. Left ventricular ejection fraction by PLAX is 54 %. The left ventricle has low normal function. The left ventricle demonstrates global hypokinesis. The left ventricular internal cavity size was normal in size. There is no left ventricular hypertrophy. Left ventricular diastolic function could not be evaluated due to atrial fibrillation. Left ventricular diastolic function could not be evaluated. Right Ventricle: The right ventricular size is normal. No increase in right ventricular wall thickness. Right ventricular systolic function is normal. Tricuspid regurgitation signal is inadequate for assessing PA pressure. Left Atrium: Left atrial size was normal in size. Right Atrium: Right atrial size was mildly dilated. Prominent Chiari network. Pericardium: There is no evidence of pericardial effusion. Mitral Valve: The mitral valve is grossly normal. Trivial mitral valve  regurgitation. Tricuspid Valve: The tricuspid valve is grossly normal. Tricuspid valve regurgitation is trivial. Aortic Valve: The aortic valve is tricuspid. Aortic valve regurgitation is trivial. Aortic valve sclerosis/calcification is present, without any evidence of aortic stenosis. Aortic valve peak gradient measures 4.0 mmHg. Pulmonic Valve: The pulmonic valve was normal in structure. Pulmonic valve regurgitation is not visualized. Aorta: The aortic root and ascending aorta are structurally  normal, with no evidence of dilitation. Venous: The inferior vena cava is dilated in size with greater than 50% respiratory variability, suggesting right atrial pressure of 8 mmHg. IAS/Shunts: No atrial level shunt detected by color flow Doppler.  LEFT VENTRICLE PLAX 2D LV EF:         Left ventricular ejection fraction by PLAX is 54 %. LVIDd:         4.70 cm LVIDs:         3.40 cm LV PW:         1.00 cm LV IVS:        1.00 cm LVOT diam:     2.50 cm LV SV:         70 LV SV Index:   34 LVOT Area:     4.91 cm  LV Volumes (MOD) LV vol d, MOD A2C: 116.0 ml LV vol d, MOD A4C: 117.0 ml LV vol s, MOD A2C: 52.9 ml LV vol s, MOD A4C: 53.1 ml LV SV MOD A2C:     63.1 ml LV SV MOD A4C:     117.0 ml LV SV MOD BP:      67.0 ml RIGHT VENTRICLE          IVC RV Basal diam:  4.20 cm  IVC diam: 2.20 cm RV Mid diam:    3.10 cm TAPSE (M-mode): 1.9 cm LEFT ATRIUM             Index        RIGHT ATRIUM           Index LA diam:        2.50 cm 1.19 cm/m   RA Area:     12.90 cm LA Vol (A2C):   43.8 ml 20.92 ml/m  RA Volume:   29.40 ml  14.04 ml/m LA Vol (A4C):   33.0 ml 15.76 ml/m LA Biplane Vol: 39.8 ml 19.01 ml/m  AORTIC VALVE AV Area (Vmax): 3.77 cm AV Vmax:        100.53 cm/s AV Peak Grad:   4.0 mmHg LVOT Vmax:      77.23 cm/s LVOT Vmean:     52.933 cm/s LVOT VTI:       0.143 m  AORTA Ao Root diam: 3.80 cm  SHUNTS Systemic VTI:  0.14 m Systemic Diam: 2.50 cm Zoila Shutter MD Electronically signed by Zoila Shutter MD Signature Date/Time:  05/15/2023/3:44:16 PM    Final    DG Chest 2 View Result Date: 05/14/2023 CLINICAL DATA:  Chest pain EXAM: CHEST - 2 VIEW COMPARISON:  03/25/2023 FINDINGS: Minimal scarring at the left lung base. Lungs are otherwise clear. No pneumothorax or pleural effusion. Cardiac size is within normal limits. Thoracic aorta is tortuous and ectatic, unchanged. Pulmonary vascularity is normal. No acute bone abnormality. IMPRESSION: 1. No active cardiopulmonary disease. 2. Tortuous and ectatic thoracic aorta, unchanged. Electronically Signed   By: Helyn Numbers M.D.   On: 05/14/2023 22:27    Microbiology: No results found for this or any previous visit (from the past 240 hours).   Labs: Basic Metabolic Panel: Recent Labs  Lab 05/14/23 2138 05/15/23 0638 05/16/23 0454  NA 134* 139 139  K 5.0 3.8 3.9  CL 101 105 108  CO2 23 22 24   GLUCOSE 239* 205* 207*  BUN 13 10 10   CREATININE 0.93 0.95 0.85  CALCIUM 9.3 9.4 9.2  MG 2.2 2.1  --   PHOS  --  3.3  --  Liver Function Tests: Recent Labs  Lab 05/14/23 2138 05/15/23 0638 05/16/23 0454  AST 42* 23 19  ALT 13 17 13   ALKPHOS 106 87 81  BILITOT 2.3* 1.4* 1.5*  PROT 6.3* 6.3* 5.8*  ALBUMIN 3.6 3.5 3.3*   No results for input(s): "LIPASE", "AMYLASE" in the last 168 hours. Recent Labs  Lab 05/16/23 0454  AMMONIA 35   CBC: Recent Labs  Lab 05/14/23 2138 05/16/23 0454  WBC 8.3 8.6  NEUTROABS 4.6  --   HGB 16.0 15.5  HCT 46.9 45.9  MCV 96.3 96.4  PLT 173 141*   Cardiac Enzymes: No results for input(s): "CKTOTAL", "CKMB", "CKMBINDEX", "TROPONINI" in the last 168 hours. BNP: BNP (last 3 results) No results for input(s): "BNP" in the last 8760 hours.  ProBNP (last 3 results) No results for input(s): "PROBNP" in the last 8760 hours.  CBG: Recent Labs  Lab 05/15/23 0914 05/15/23 1528 05/16/23 0230 05/16/23 0816  GLUCAP 266* 249* 227* 210*       Signed:  Ramiro Harvest MD.  [facility] 05/16/2023, 11:08 AM

## 2023-05-16 NOTE — ED Notes (Signed)
 Patient Alert and oriented to baseline. Stable and ambulatory to baseline. Patient verbalized understanding of the discharge instructions.  Patient belongings were taken by the patient.

## 2023-05-16 NOTE — ED Notes (Signed)
Restraints removed at this time as patient is sleeping.

## 2023-05-16 NOTE — ED Notes (Signed)
Pt remains agitated and restless in bilateral soft wrist restraints after additional medication administration. This RN requested MD Sundil come to bedside to assess patient at this time.

## 2023-05-16 NOTE — Progress Notes (Signed)
Patient Name: Adam Brady Date of Encounter: 05/16/2023 [facility] Cardiologist: Garwin Brothers, MD   Interval Summary  .    Very agitated and delirious overnight. Sleepy this morning but arousable. Still confused.   Vital Signs .    Vitals:   05/16/23 0630 05/16/23 0800 05/16/23 0830 05/16/23 0900  BP: (!) 106/59 109/74 107/65 130/87  Pulse:  (!) 34 (!) 56 86  Resp: 18 17 16  (!) 22  Temp:      TempSrc:      SpO2: 96% 94% 96% 99%  Weight:      Height:       No intake or output data in the 24 hours ending 05/16/23 1035    05/14/2023    9:25 PM 04/23/2023   11:22 AM 12/08/2021   11:04 AM  Last 3 Weights  Weight (lbs) 192 lb 0.3 oz 192 lb 3.2 oz 198 lb 9.6 oz  Weight (kg) 87.1 kg 87.181 kg 90.084 kg      Telemetry/ECG    Sinus rhythm with frequent PACs, PVCs, bursts of PSVT. No significant bradycardia or pause alerts. - Personally Reviewed  Physical Exam .   GEN: No acute distress.   Neck: No JVD Cardiac: Normal rate, irregular rhythm. Respiratory: Normal work of breathing.  GI: Soft, nontender, non-distended  MS: No edema  Echo 2/1:  1. Left ventricular ejection fraction, by estimation, is 50 to 55%. Left  ventricular ejection fraction by PLAX is 54 %. The left ventricle has low  normal function. The left ventricle demonstrates global hypokinesis. Left  ventricular diastolic function  could not be evaluated.   2. Right ventricular systolic function is normal. The right ventricular  size is normal. Tricuspid regurgitation signal is inadequate for assessing  PA pressure.   3. Right atrial size was mildly dilated.   4. The mitral valve is grossly normal. Trivial mitral valve  regurgitation.   5. The aortic valve is tricuspid. Aortic valve regurgitation is trivial.  Aortic valve sclerosis/calcification is present, without any evidence of  aortic stenosis.   6. The inferior vena cava is dilated in size with >50% respiratory  variability, suggesting  right atrial pressure of 8 mmHg.   Assessment & Plan .     Gotham Raden is a 89 y.o. male with a hx of hypertension, hyperlipidemia, diabetes, aortic atherosclerosis by imaging, aortic ectasia, very frequent PAC/PVCs who is being evaluated for abnormal ZIO monitor.  Patient was found to have an irregular rhythm by his PCP was sent to the ED for evaluation where he was found to have frequent PACs and PVCs.  He was started on metoprolol tartrate 12.5 mg twice daily.  Zio monitor was ordered which showed multiple episodes of AV block, almost all of which appeared to be occurring during sleep.  He did have 1 episode of Mobitz II AV block at 7pm and is uncertain if awake or not. Zio monitor was also notable for 537 episodes of SVT, longest lasting 39 seconds, and 20% PAC and 4% PVC burden.  He does report some dizziness and palpitations at home.  He meets criteria for pacemaker implant due to presence of tachybradycardia syndrome.   Patient very delirious and agitated overnight. No bradycardia or pauses off metoprolol. In efforts to minimize hospital delirious while waiting several days for a non-urgent PPM implant, I think it is best if pacemaker implant is done as an outpatient.    # Tachycardia-bradycardia syndrome # Frequent PACs # Frequent PVCs #  SVT -Permanent pacemaker was offered.  Risk and benefits of the procedure were discussed extensively with the patient and his family. Tentatively planning for pacemaker implant on Tuesday as outpatient. Will plan to start high dose metoprolol after implant for arrhythmia suppression.  -Stop metoprolol.   For questions or updates, please contact [facility] Please consult [email or web address] for contact info under        Signed, Nobie Putnam, MD

## 2023-05-16 NOTE — ED Notes (Signed)
Pt extremely agitated and becoming physically aggressive with staff at this time. MD paged for medication order.

## 2023-05-16 NOTE — ED Notes (Addendum)
Pt remains agitated and restless, continues to attempt to get out of bed despite constant verbal redirection by family at bedside. This RN requested order for soft wrist restraints at this time.

## 2023-05-17 ENCOUNTER — Telehealth: Payer: Self-pay | Admitting: Cardiology

## 2023-05-17 NOTE — Telephone Encounter (Signed)
Patient's daughter states patient decided to cancel 3/03 PPM implant with Dr. Jimmey Ralph.

## 2023-05-18 ENCOUNTER — Telehealth: Payer: Self-pay | Admitting: Cardiology

## 2023-05-18 DIAGNOSIS — I442 Atrioventricular block, complete: Secondary | ICD-10-CM | POA: Diagnosis not present

## 2023-05-18 DIAGNOSIS — E785 Hyperlipidemia, unspecified: Secondary | ICD-10-CM | POA: Diagnosis not present

## 2023-05-18 DIAGNOSIS — I700 Atherosclerosis of aorta: Secondary | ICD-10-CM | POA: Diagnosis not present

## 2023-05-18 DIAGNOSIS — E1159 Type 2 diabetes mellitus with other circulatory complications: Secondary | ICD-10-CM | POA: Diagnosis not present

## 2023-05-18 DIAGNOSIS — E1169 Type 2 diabetes mellitus with other specified complication: Secondary | ICD-10-CM | POA: Diagnosis not present

## 2023-05-18 DIAGNOSIS — I152 Hypertension secondary to endocrine disorders: Secondary | ICD-10-CM | POA: Diagnosis not present

## 2023-05-18 DIAGNOSIS — J849 Interstitial pulmonary disease, unspecified: Secondary | ICD-10-CM | POA: Diagnosis not present

## 2023-05-18 DIAGNOSIS — N1831 Chronic kidney disease, stage 3a: Secondary | ICD-10-CM | POA: Diagnosis not present

## 2023-05-18 NOTE — Telephone Encounter (Signed)
 Called patient's daughter and she was asking about instructions for the patient's pacemaker procedure on 3/3. I explained that I would forward this to Dr. Shaune nurse so she can review the instructions with her. Patient verbalized understanding and had no further questions at this time.

## 2023-05-18 NOTE — Telephone Encounter (Signed)
Spoke with the patient's daughter who is requesting a printed copy of the patient's procedure instructions mailed to her address as well.

## 2023-05-18 NOTE — Telephone Encounter (Signed)
Called pt to confirm that he wants to cancel and he does NOT want to cancel. He did not know anything about his Daughter calling. He would like to proceed with the PPM Implant.   I assured him that I would not cancel his procedure.

## 2023-05-18 NOTE — Telephone Encounter (Signed)
Daughter Victorino Dike) called to get instructions for patient scheduled for a procedure on 3/3.

## 2023-06-07 ENCOUNTER — Encounter: Payer: Self-pay | Admitting: Cardiology

## 2023-06-07 ENCOUNTER — Ambulatory Visit: Payer: Medicare HMO | Attending: Cardiology | Admitting: Cardiology

## 2023-06-07 VITALS — BP 120/62 | HR 52 | Ht 72.0 in | Wt 194.0 lb

## 2023-06-07 DIAGNOSIS — R001 Bradycardia, unspecified: Secondary | ICD-10-CM

## 2023-06-07 DIAGNOSIS — I441 Atrioventricular block, second degree: Secondary | ICD-10-CM | POA: Diagnosis not present

## 2023-06-07 DIAGNOSIS — E785 Hyperlipidemia, unspecified: Secondary | ICD-10-CM | POA: Diagnosis not present

## 2023-06-07 DIAGNOSIS — E1169 Type 2 diabetes mellitus with other specified complication: Secondary | ICD-10-CM | POA: Diagnosis not present

## 2023-06-07 DIAGNOSIS — I700 Atherosclerosis of aorta: Secondary | ICD-10-CM

## 2023-06-07 DIAGNOSIS — I493 Ventricular premature depolarization: Secondary | ICD-10-CM

## 2023-06-07 NOTE — Progress Notes (Signed)
 Cardiology Office Note:  .   Date:  06/07/2023  ID:  Adam Brady, DOB 09-27-1933, MRN [id number] PCP: Buckner Malta, MD  [facility] Providers Cardiologist:  Garwin Brothers, MD    History of Present Illness: .   Adam Brady is a 89 y.o. male with a past medical history of HTN, bradycardia, aortic atherosclerosis, palpitations, frequent PVCs and PACs, second-degree AV block, SVT, DM2.   04/23/2023 monitor average heart rate 75 bpm, predominant rhythm was sinus, 2 pauses occurred lasting 6.2 seconds secondary to high-grade AV block >> PPM implantation scheduled for 06/14/2023 12/17/21 echo EF 55-60%, grade I DD, mild aortic regurgitation   Most recently evaluated by Dr. Tomie China on 12/08/21, an echo was arranged, he was advised to follow up in 6 months.   Evaluated 04/23/2023  for follow-up after recent ED visit.  He apparently woke up in the middle of the night, was unusually cold, his wife checked his pulse ox and heart rate in noted that his heart rate was 40, pulse ox was in the 80s.  The following day she presented to an urgent care for follow-up, they obtained an EKG which was abnormal and he was transferred to [facility].  Evaluated in the emergency department, workup did not reveal any abnormalities with his EKG, labs were noncontributory, he was discharged home.  We arranged for 2-week monitor which revealed high grade AV block >> advised to follow-up with the hospital, his beta-blocker was held, evaluated by EP and arranged for outpatient PPM implantation which is scheduled for 06/14/2023.  He presents today for follow-up of his bradycardia accompanied by his daughter and his wife.  He is doing okay from a cardiac perspective, states he has days where he feels well, days where he does not feel well.  On the days he does not feel well he typically feels dizzy, needs to take his meclizine.  He has not had any syncopal events.  PPM implantation is scheduled for 06/14/2023. He  denies chest pain, palpitations, dyspnea, pnd, orthopnea, n, v,syncope, edema, weight gain, or early satiety.   ROS: Review of Systems  Neurological:  Positive for dizziness.  All other systems reviewed and are negative.    Studies Reviewed: .           Cardiac Studies & Procedures   ______________________________________________________________________________________________     ECHOCARDIOGRAM  ECHOCARDIOGRAM COMPLETE 05/15/2023  Narrative ECHOCARDIOGRAM REPORT    Patient Name:   Adam Brady Date of Exam: 05/15/2023 Medical Rec #:  [id number]      Height:       72.0 in Accession #:    [id number]     Weight:       192.0 lb Date of Birth:  1934-03-27     BSA:          2.094 m Patient Age:    89 years       BP:           136/73 mmHg Patient Gender: M              HR:           104 bpm. Exam Location:  Inpatient  Procedure: 2D Echo, Color Doppler and Cardiac Doppler  Indications:    Abnormal ECG  History:        Patient has prior history of Echocardiogram examinations, most recent 12/17/2021. CAD, Arrythmias:Bradycardia and Atrial Fibrillation, Signs/Symptoms:Murmur, Syncope and Fatigue; Risk Factors:Hypertension, Dyslipidemia, Former Smoker and Diabetes.  Sonographer:  Raeford Razor RDCS Referring Phys: [id number] JOSHUA PARKER  IMPRESSIONS   1. Left ventricular ejection fraction, by estimation, is 50 to 55%. Left ventricular ejection fraction by PLAX is 54 %. The left ventricle has low normal function. The left ventricle demonstrates global hypokinesis. Left ventricular diastolic function could not be evaluated. 2. Right ventricular systolic function is normal. The right ventricular size is normal. Tricuspid regurgitation signal is inadequate for assessing PA pressure. 3. Right atrial size was mildly dilated. 4. The mitral valve is grossly normal. Trivial mitral valve regurgitation. 5. The aortic valve is tricuspid. Aortic valve regurgitation is trivial. Aortic valve  sclerosis/calcification is present, without any evidence of aortic stenosis. 6. The inferior vena cava is dilated in size with >50% respiratory variability, suggesting right atrial pressure of 8 mmHg.  Comparison(s): Changes from prior study are noted. 12/17/2021: LVEF 55-60%, mild AI.  FINDINGS Left Ventricle: Left ventricular ejection fraction, by estimation, is 50 to 55%. Left ventricular ejection fraction by PLAX is 54 %. The left ventricle has low normal function. The left ventricle demonstrates global hypokinesis. The left ventricular internal cavity size was normal in size. There is no left ventricular hypertrophy. Left ventricular diastolic function could not be evaluated due to atrial fibrillation. Left ventricular diastolic function could not be evaluated.  Right Ventricle: The right ventricular size is normal. No increase in right ventricular wall thickness. Right ventricular systolic function is normal. Tricuspid regurgitation signal is inadequate for assessing PA pressure.  Left Atrium: Left atrial size was normal in size.  Right Atrium: Right atrial size was mildly dilated. Prominent Chiari network.  Pericardium: There is no evidence of pericardial effusion.  Mitral Valve: The mitral valve is grossly normal. Trivial mitral valve regurgitation.  Tricuspid Valve: The tricuspid valve is grossly normal. Tricuspid valve regurgitation is trivial.  Aortic Valve: The aortic valve is tricuspid. Aortic valve regurgitation is trivial. Aortic valve sclerosis/calcification is present, without any evidence of aortic stenosis. Aortic valve peak gradient measures 4.0 mmHg.  Pulmonic Valve: The pulmonic valve was normal in structure. Pulmonic valve regurgitation is not visualized.  Aorta: The aortic root and ascending aorta are structurally normal, with no evidence of dilitation.  Venous: The inferior vena cava is dilated in size with greater than 50% respiratory variability, suggesting right  atrial pressure of 8 mmHg.  IAS/Shunts: No atrial level shunt detected by color flow Doppler.   LEFT VENTRICLE PLAX 2D LV EF:         Left ventricular ejection fraction by PLAX is 54 %. LVIDd:         4.70 cm LVIDs:         3.40 cm LV PW:         1.00 cm LV IVS:        1.00 cm LVOT diam:     2.50 cm LV SV:         70 LV SV Index:   34 LVOT Area:     4.91 cm  LV Volumes (MOD) LV vol d, MOD A2C: 116.0 ml LV vol d, MOD A4C: 117.0 ml LV vol s, MOD A2C: 52.9 ml LV vol s, MOD A4C: 53.1 ml LV SV MOD A2C:     63.1 ml LV SV MOD A4C:     117.0 ml LV SV MOD BP:      67.0 ml  RIGHT VENTRICLE          IVC RV Basal diam:  4.20 cm  IVC diam: 2.20 cm RV Mid  diam:    3.10 cm TAPSE (M-mode): 1.9 cm  LEFT ATRIUM             Index        RIGHT ATRIUM           Index LA diam:        2.50 cm 1.19 cm/m   RA Area:     12.90 cm LA Vol (A2C):   43.8 ml 20.92 ml/m  RA Volume:   29.40 ml  14.04 ml/m LA Vol (A4C):   33.0 ml 15.76 ml/m LA Biplane Vol: 39.8 ml 19.01 ml/m AORTIC VALVE AV Area (Vmax): 3.77 cm AV Vmax:        100.53 cm/s AV Peak Grad:   4.0 mmHg LVOT Vmax:      77.23 cm/s LVOT Vmean:     52.933 cm/s LVOT VTI:       0.143 m  AORTA Ao Root diam: 3.80 cm   SHUNTS Systemic VTI:  0.14 m Systemic Diam: 2.50 cm  Zoila Shutter MD Electronically signed by Zoila Shutter MD Signature Date/Time: 05/15/2023/3:44:16 PM    Final    MONITORS  LONG TERM MONITOR (3-14 DAYS) 05/17/2023  Narrative Patch Wear Time:  13 days and 23 hours (2025-01-10T11:56:50-0500 to 2025-01-24T10:59:37-0500)  Patient had a min HR of 22 bpm, max HR of 194 bpm, and avg HR of 75 bpm.  Predominant underlying rhythm was Sinus Rhythm.    2 Pauses occurred, the longest lasting 6.2 secs (10 bpm). Pauses occurred due to possible High Grade AV Block, Second Degree AV Block and Non-Conducted SVE(s). 14 episode(s) of AV Block (2nd) occurred, lasting a total of 53 secs. Second Degree AV Block-Mobitz I  (Wenckebach) was present. Isolated SVEs were frequent (20.7%, [id number]), SVE Couplets were rare (<1.0%, 4197), and SVE Triplets were rare (<1.0%, 1623).  Isolated VEs were occasional (3.9%, [id number]), VE Couplets were rare (<1.0%, 2806), and VE Triplets were rare (<1.0%, 119). Ventricular Bigeminy and Trigeminy were present. MD notification criteria for High Grade AV Block (6.2 seconds of Ventricular Asystole) and Second Degree AV Block met - report posted prior to notification per account request (DI).3 Ventricular Tachycardia runs occurred, the run with the fastest interval lasting 6 beats with a max rate of 194 bpm (avg 167 bpm); the run with the fastest interval was also the longest. 537 Supraventricular Tachycardia (SVT) runs occurred, the run with the fastest interval lasting 21.3 secs with a max rate of 174 bpm, the longest lasting 39.0 secs with an avg rate of 128 bpm.       ______________________________________________________________________________________________      Risk Assessment/Calculations:             Physical Exam:   VS:  BP 120/62 (BP Location: Right Arm, Patient Position: Sitting, Cuff Size: Normal)   Pulse (!) 52   Ht 6' (1.829 m)   Wt 194 lb (88 kg)   SpO2 94%   BMI 26.31 kg/m    Wt Readings from Last 3 Encounters:  06/07/23 194 lb (88 kg)  05/14/23 192 lb 0.3 oz (87.1 kg)  04/23/23 192 lb 3.2 oz (87.2 kg)    GEN: Well nourished, well developed in no acute distress NECK: No JVD; No carotid bruits CARDIAC: RRR, no murmurs, rubs, gallops RESPIRATORY:  Clear to auscultation without rales, wheezing or rhonchi  ABDOMEN: Soft, non-tender, non-distended EXTREMITIES:  No edema; No deformity   ASSESSMENT AND PLAN: .   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  He has days where he is bothered by dizziness, other days he is asymptomatic.  Will check CBC BMET today.    Aortic atherosclerosis-this was noted on CT  imaging, Stable with no anginal symptoms. No indication for ischemic evaluation.  Continue aspirin, continue Zocor.  Dyslipidemia-this appears to be monitored by his PCP, continue Zocor 40 mg daily.  DM2-monitored by PCP, currently on Glucophage.       Dispo: CBC, BMET today.  Follow-up with general cardiology in 3 months.  Signed, Flossie Dibble, NP

## 2023-06-07 NOTE — Patient Instructions (Signed)
 Medication Instructions:  Your physician recommends that you continue on your current medications as directed. Please refer to the Current Medication list given to you today.  *If you need a refill on your cardiac medications before your next appointment, please call your pharmacy*   Lab Work: Your physician recommends that you have a CBC and BMP today in the office.  If you have labs (blood work) drawn today and your tests are completely normal, you will receive your results only by: MyChart Message (if you have MyChart) OR A paper copy in the mail If you have any lab test that is abnormal or we need to change your treatment, we will call you to review the results.   Testing/Procedures: None ordered   Follow-Up: At [facility], you and your health needs are our priority.  As part of our continuing mission to provide you with exceptional heart care, we have created designated Provider Care Teams.  These Care Teams include your primary Cardiologist (physician) and Advanced Practice Providers (APPs -  Physician Assistants and Nurse Practitioners) who all work together to provide you with the care you need, when you need it.  We recommend signing up for the patient portal called "MyChart".  Sign up information is provided on this After Visit Summary.  MyChart is used to connect with patients for Virtual Visits (Telemedicine).  Patients are able to view lab/test results, encounter notes, upcoming appointments, etc.  Non-urgent messages can be sent to your provider as well.   To learn more about what you can do with MyChart, go to [email or web address].    Your next appointment:   3 month(s)  The format for your next appointment:   In Person  Provider:   Belva Crome, MD    Other Instructions none  Important Information About Sugar

## 2023-06-08 LAB — CBC
Hematocrit: 48.3 % (ref 37.5–51.0)
Hemoglobin: 16.1 g/dL (ref 13.0–17.7)
MCH: 32.2 pg (ref 26.6–33.0)
MCHC: 33.3 g/dL (ref 31.5–35.7)
MCV: 97 fL (ref 79–97)
Platelets: 181 x10E3/uL (ref 150–450)
RBC: 5 x10E6/uL (ref 4.14–5.80)
RDW: 12.6 % (ref 11.6–15.4)
WBC: 10 x10E3/uL (ref 3.4–10.8)

## 2023-06-08 LAB — BASIC METABOLIC PANEL WITH GFR
BUN/Creatinine Ratio: 11 (ref 10–24)
BUN: 11 mg/dL (ref 8–27)
CO2: 24 mmol/L (ref 20–29)
Calcium: 9.5 mg/dL (ref 8.6–10.2)
Chloride: 101 mmol/L (ref 96–106)
Creatinine, Ser: 1.01 mg/dL (ref 0.76–1.27)
Glucose: 200 mg/dL — ABNORMAL HIGH (ref 70–99)
Potassium: 4.8 mmol/L (ref 3.5–5.2)
Sodium: 141 mmol/L (ref 134–144)
eGFR: 72 mL/min/1.73

## 2023-06-10 ENCOUNTER — Telehealth: Payer: Self-pay | Admitting: Emergency Medicine

## 2023-06-10 NOTE — Telephone Encounter (Signed)
-----   Message from Flossie Dibble sent at 06/08/2023  7:32 AM EST ----- Labs look good for upcoming surgery.

## 2023-06-10 NOTE — Telephone Encounter (Signed)
 Results reviewed with pt as per Wallis Bamberg NP's note.  Pt verbalized understanding and had no additional questions. Routed to PCP.

## 2023-06-11 NOTE — Pre-Procedure Instructions (Signed)
Instructed patient on the following items: Arrival time 1030  Nothing to eat or drink after midnight No meds AM of procedure Responsible person to drive you home and stay with you for 24 hrs Wash with special soap night before and morning of procedure 

## 2023-06-14 ENCOUNTER — Encounter (HOSPITAL_COMMUNITY): Payer: Self-pay | Admitting: Cardiology

## 2023-06-14 ENCOUNTER — Encounter (HOSPITAL_COMMUNITY)
Admission: RE | Disposition: A | Discharge status: Home / Self Care | Payer: Medicare HMO | Source: Home / Self Care | Attending: Cardiology

## 2023-06-14 ENCOUNTER — Ambulatory Visit (HOSPITAL_COMMUNITY)

## 2023-06-14 ENCOUNTER — Ambulatory Visit (HOSPITAL_COMMUNITY)
Admission: RE | Admit: 2023-06-14 | Discharge: 2023-06-14 | Disposition: A | Discharge status: Home / Self Care | Payer: Medicare HMO | Attending: Cardiology | Admitting: Cardiology

## 2023-06-14 ENCOUNTER — Other Ambulatory Visit: Payer: Self-pay

## 2023-06-14 DIAGNOSIS — I471 Supraventricular tachycardia, unspecified: Secondary | ICD-10-CM | POA: Insufficient documentation

## 2023-06-14 DIAGNOSIS — I700 Atherosclerosis of aorta: Secondary | ICD-10-CM | POA: Insufficient documentation

## 2023-06-14 DIAGNOSIS — Z79899 Other long term (current) drug therapy: Secondary | ICD-10-CM | POA: Insufficient documentation

## 2023-06-14 DIAGNOSIS — N1831 Chronic kidney disease, stage 3a: Secondary | ICD-10-CM | POA: Diagnosis not present

## 2023-06-14 DIAGNOSIS — I771 Stricture of artery: Secondary | ICD-10-CM | POA: Diagnosis not present

## 2023-06-14 DIAGNOSIS — Z794 Long term (current) use of insulin: Secondary | ICD-10-CM | POA: Insufficient documentation

## 2023-06-14 DIAGNOSIS — E1122 Type 2 diabetes mellitus with diabetic chronic kidney disease: Secondary | ICD-10-CM | POA: Insufficient documentation

## 2023-06-14 DIAGNOSIS — I493 Ventricular premature depolarization: Secondary | ICD-10-CM | POA: Insufficient documentation

## 2023-06-14 DIAGNOSIS — Z87891 Personal history of nicotine dependence: Secondary | ICD-10-CM | POA: Insufficient documentation

## 2023-06-14 DIAGNOSIS — I129 Hypertensive chronic kidney disease with stage 1 through stage 4 chronic kidney disease, or unspecified chronic kidney disease: Secondary | ICD-10-CM | POA: Diagnosis not present

## 2023-06-14 DIAGNOSIS — Z95810 Presence of automatic (implantable) cardiac defibrillator: Secondary | ICD-10-CM | POA: Diagnosis not present

## 2023-06-14 DIAGNOSIS — I495 Sick sinus syndrome: Secondary | ICD-10-CM | POA: Insufficient documentation

## 2023-06-14 HISTORY — PX: PACEMAKER IMPLANT: EP1218

## 2023-06-14 LAB — GLUCOSE, CAPILLARY
Glucose-Capillary: 194 mg/dL — ABNORMAL HIGH (ref 70–99)
Glucose-Capillary: 203 mg/dL — ABNORMAL HIGH (ref 70–99)

## 2023-06-14 SURGERY — PACEMAKER IMPLANT

## 2023-06-14 MED ORDER — SODIUM CHLORIDE 0.9 % IV SOLN
INTRAVENOUS | Status: AC
  Filled 2023-06-14: qty 2

## 2023-06-14 MED ORDER — POVIDONE-IODINE 10 % EX SWAB
2.0000 | Freq: Once | CUTANEOUS | Status: AC
  Administered 2023-06-14: 2 via TOPICAL

## 2023-06-14 MED ORDER — ONDANSETRON HCL 4 MG/2ML IJ SOLN: 4.0000 mg | Freq: Four times a day (QID) | INTRAMUSCULAR | Status: DC | PRN

## 2023-06-14 MED ORDER — LIDOCAINE HCL (PF) 1 % IJ SOLN
INTRAMUSCULAR | Status: DC | PRN
  Administered 2023-06-14: 60 mL

## 2023-06-14 MED ORDER — SODIUM CHLORIDE 0.9 % IV SOLN
80.0000 mg | INTRAVENOUS | Status: AC
  Administered 2023-06-14: 80 mg

## 2023-06-14 MED ORDER — HEPARIN (PORCINE) IN NACL 1000-0.9 UT/500ML-% IV SOLN
INTRAVENOUS | Status: DC | PRN
  Administered 2023-06-14: 500 mL

## 2023-06-14 MED ORDER — CHLORHEXIDINE GLUCONATE 4 % EX SOLN: 4.0000 | Freq: Once | CUTANEOUS | Status: DC

## 2023-06-14 MED ORDER — LIDOCAINE HCL 1 % IJ SOLN
INTRAMUSCULAR | Status: AC
  Filled 2023-06-14: qty 60

## 2023-06-14 MED ORDER — FENTANYL CITRATE (PF) 100 MCG/2ML IJ SOLN
INTRAMUSCULAR | Status: DC | PRN
  Administered 2023-06-14 (×2): 25 ug via INTRAVENOUS

## 2023-06-14 MED ORDER — CEFAZOLIN SODIUM-DEXTROSE 2-4 GM/100ML-% IV SOLN
INTRAVENOUS | Status: AC
  Filled 2023-06-14: qty 100

## 2023-06-14 MED ORDER — ACETAMINOPHEN 325 MG PO TABS: 325.0000 mg | ORAL_TABLET | ORAL | Status: DC | PRN

## 2023-06-14 MED ORDER — CEFAZOLIN SODIUM-DEXTROSE 2-4 GM/100ML-% IV SOLN
2.0000 g | INTRAVENOUS | Status: AC
  Administered 2023-06-14: 2 g via INTRAVENOUS

## 2023-06-14 MED ORDER — SODIUM CHLORIDE 0.9 % IV SOLN: INTRAVENOUS | Status: DC

## 2023-06-14 MED ORDER — FENTANYL CITRATE (PF) 100 MCG/2ML IJ SOLN
INTRAMUSCULAR | Status: AC
  Filled 2023-06-14: qty 2

## 2023-06-14 MED ORDER — MIDAZOLAM HCL 5 MG/5ML IJ SOLN
INTRAMUSCULAR | Status: AC
  Filled 2023-06-14: qty 5

## 2023-06-14 MED ORDER — METOPROLOL SUCCINATE ER 25 MG PO TB24
25.0000 mg | ORAL_TABLET | Freq: Two times a day (BID) | ORAL | 2 refills | Status: DC
Start: 2023-06-14 — End: 2023-09-16

## 2023-06-14 SURGICAL SUPPLY — 13 items
CABLE SURGICAL S-101-97-12 (CABLE) ×1 IMPLANT
CATH RIGHTSITE C315HIS02 (CATHETERS) IMPLANT
IPG PACE AZUR XT DR MRI W1DR01 (Pacemaker) IMPLANT
LEAD CAPSURE NOVUS 5076-52CM (Lead) IMPLANT
LEAD SELECT SECURE 3830 383069 (Lead) IMPLANT
PACE AZURE XT DR MRI W1DR01 (Pacemaker) ×1 IMPLANT
PAD DEFIB RADIO PHYSIO CONN (PAD) ×1 IMPLANT
SELECT SECURE 3830 383069 (Lead) ×1 IMPLANT
SHEATH 7FR PRELUDE SNAP 13 (SHEATH) IMPLANT
SHEATH PROBE COVER 6X72 (BAG) IMPLANT
SLITTER 6232ADJ (MISCELLANEOUS) IMPLANT
TRAY PACEMAKER INSERTION (PACKS) ×1 IMPLANT
WIRE HI TORQ VERSACORE-J 145CM (WIRE) IMPLANT

## 2023-06-14 NOTE — H&P (Signed)
 EP Consultation    Patient ID: Adam Brady MRN: [id number]; DOB: 1933/09/24   Date: 06/14/2023   PCP:  Buckner Malta, MD              [facility] Providers Cardiologist:  Garwin Brothers, MD          HPI:    Adam Brady is a 89 y.o. male with a hx of hypertension, hyperlipidemia, diabetes, aortic atherosclerosis by imaging, aortic ectasia, very frequent PAC/PVCs who was told to come to the emergency room for evaluation after having a pause on a ZIO monitor.  Patient reports that he was sleeping during these episodes and was not aware of them.  Zio monitor had been ordered by a cardiologist after he was started on metoprolol for frequent PACs and PVCs during an emergency room visit.  Patient reports occasional dizzy episodes at home but has not lost consciousness or fallen.  He also reports occasional palpitations but does not feel like they are significantly distressing to him.  Currently, he reports feeling relatively well and has no new or acute complaints.  Interval: Patient presents today for scheduled pacemaker implant. Continue to have episodes of dizziness. Otherwise doing well. No new or acute complaints today.        Past Medical History:  Diagnosis Date   Abnormal weight gain     Acute respiratory failure due to COVID-19 (HCC) 04/16/2019   Allergic rhinitis with postnasal drip     Atherosclerosis of aorta (HCC)     Atherosclerosis of native coronary artery without angina pectoris     BMI 28.0-28.9,adult     BMI 29.0-29.9,adult     Bradycardia 12/08/2021   Bradycardia, sinus     CAD (coronary artery disease) 04/16/2019   Calcification of artery     Cardiac murmur 12/08/2021   Carotid artery stenosis     Ceruminosis, bilateral     Chronic constipation     Dyspnea     ED (erectile dysfunction)     Eustachian tube dysfunction, bilateral     Fatigue     History of hiatal hernia     HLD (hyperlipidemia) 04/16/2019   HTN (hypertension)  04/16/2019   Mild hypercholesterolemia     Near syncope     Orthostatic hypotension     Pain and swelling of left lower leg     Pneumonia due to COVID-19 virus 04/16/2019   Postoperative examination 05/11/2016   Ptosis of right upper eyelid 12/08/2021   Rickettsiosis, tick-borne     Stage 3a chronic kidney disease (CKD) (HCC)     Stage 3a chronic kidney disease (CKD) (HCC)     Thyroid nodule     Type 2 diabetes mellitus with hyperlipidemia (HCC)     Type 2 diabetes mellitus with hyperlipidemia (HCC)     Vertigo, labyrinthine                 Past Surgical History:  Procedure Laterality Date   CARDIAC CATHETERIZATION       CHOLECYSTECTOMY, LAPAROSCOPIC       HERNIA REPAIR        X 3          Inpatient Medications: Scheduled Meds:  insulin aspart  0-6 Units Subcutaneous TID WC   linaclotide  290 mcg Oral Daily   simvastatin  40 mg Oral Daily   sodium chloride flush  3 mL Intravenous Q12H   tamsulosin  0.4 mg Oral Daily  PRN Meds: acetaminophen, albuterol, melatonin, polyethylene glycol       Allergies:    Allergies       Allergies  Allergen Reactions   Nitroglycerin        Reaction: hypotension        Social History:   Social History         Socioeconomic History   Marital status: Married      Spouse name: Gaffer   Number of children: Not on file   Years of education: Not on file   Highest education level: Not on file  Occupational History   Not on file  Tobacco Use   Smoking status: Former   Smokeless tobacco: Never  Vaping Use   Vaping status: Never Used  Substance and Sexual Activity   Alcohol use: Not Currently   Drug use: Not Currently   Sexual activity: Not on file  Other Topics Concern   Not on file  Social History Narrative   Not on file    Social Drivers of Health    Financial Resource Strain: Not on file  Food Insecurity: Not on file  Transportation Needs: Not on file  Physical Activity: Not on file  Stress:  Not on file  Social Connections: Not on file  Intimate Partner Violence: Not on file    Family History:        Family History  Problem Relation Age of Onset   Heart failure Mother     Depression Mother     Psychosis Mother     Hypertension Father     Heart disease Father     CVA Father     Hypercholesterolemia Father            ROS:  Please see the history of present illness.    All other ROS reviewed and negative.      Physical Exam/Data:    Today's Vitals   06/14/23 1045  BP: 133/77  Pulse: 60  Resp: 18  Temp: 97.8 F (36.6 C)  TempSrc: Oral  SpO2: 95%  Weight: 88 kg  Height: 6' (1.829 m)  PainSc: 0-No pain   Body mass index is 26.31 kg/m.  General:  Well nourished, well developed, in no acute distress HEENT: normal Neck: no JVD Vascular: No carotid bruits; Distal pulses 2+ bilaterally Cardiac: Normal rate, regular rhythm Lungs: Normal work of breathing Ext: no edema Neuro:  CNs 2-12 intact, no focal abnormalities noted Psych:  Normal affect    EKG:  The EKG was personally reviewed and demonstrates: Sinus rhythm Telemetry:  Telemetry was personally reviewed and demonstrates: Sinus rhythm with frequent PACs and PVCs.   Relevant CV Studies: Echo 01/06/2022: 1. Left ventricular ejection fraction, by estimation, is 55 to 60%. The  left ventricle has normal function. The left ventricle has no regional  wall motion abnormalities. Left ventricular diastolic parameters are  consistent with Grade I diastolic  dysfunction (impaired relaxation).   2. The aortic valve is normal in structure. Aortic valve regurgitation is  mild. No aortic stenosis is present.    Laboratory Data:   High Sensitivity Troponin:   Last Labs      Recent Labs  Lab 05/14/23 2138 05/14/23 2339  TROPONINIHS 6 6       Chemistry Last Labs      Recent Labs  Lab 05/14/23 2138 05/15/23 0638  NA 134* 139  K 5.0 3.8  CL 101 105  CO2 23 22  GLUCOSE 239* 205*  BUN 13  10   CREATININE 0.93 0.95  CALCIUM 9.3 9.4  MG 2.2 2.1  GFRNONAA >60 >60  ANIONGAP 10 12      Last Labs      Recent Labs  Lab 05/14/23 2138 05/15/23 0638  PROT 6.3* 6.3*  ALBUMIN 3.6 3.5  AST 42* 23  ALT 13 17  ALKPHOS 106 87  BILITOT 2.3* 1.4*      Lipids  Last Labs  No results for input(s): "CHOL", "TRIG", "HDL", "LABVLDL", "LDLCALC", "CHOLHDL" in the last 168 hours.    Hematology Last Labs     Recent Labs  Lab 05/14/23 2138  WBC 8.3  RBC 4.87  HGB 16.0  HCT 46.9  MCV 96.3  MCH 32.9  MCHC 34.1  RDW 13.2  PLT 173      Thyroid  Last Labs      Recent Labs  Lab 05/14/23 2138 05/15/23 0638  TSH 4.735*  --   FREET4  --  0.81      BNP Last Labs  No results for input(s): "BNP", "PROBNP" in the last 168 hours.    DDimer  Last Labs  No results for input(s): "DDIMER" in the last 168 hours.       Radiology/Studies:  DG Chest 2 View Result Date: 05/14/2023 CLINICAL DATA:  Chest pain EXAM: CHEST - 2 VIEW COMPARISON:  03/25/2023 FINDINGS: Minimal scarring at the left lung base. Lungs are otherwise clear. No pneumothorax or pleural effusion. Cardiac size is within normal limits. Thoracic aorta is tortuous and ectatic, unchanged. Pulmonary vascularity is normal. No acute bone abnormality. IMPRESSION: 1. No active cardiopulmonary disease. 2. Tortuous and ectatic thoracic aorta, unchanged. Electronically Signed   By: Helyn Numbers M.D.   On: 05/14/2023 22:27        Assessment and Plan:  Adam Brady is a 89 y.o. male with a hx of hypertension, hyperlipidemia, diabetes, aortic atherosclerosis by imaging, aortic ectasia, very frequent PAC/PVCs who is being evaluated for abnormal ZIO monitor.  Patient was found to have an irregular rhythm by his PCP was sent to the ED for evaluation where he was found to have frequent PACs and PVCs.  He was started on metoprolol tartrate 12.5 mg twice daily.  Zio monitor was ordered which showed multiple episodes of AV block.  Zio  monitor was also notable for 537 episodes of SVT, longest lasting 39 seconds, and 20% PAC and 4% PVC burden.  He does report some dizziness and palpitations at home.  He meets criteria for pacemaker implant due to presence of tachycardia-bradycardia syndrome should he choose to do so.   # Tachycardia-bradycardia syndrome # Frequent PACs # Frequent PVCs # SVT -Explained risks, benefits, and alternatives to pacemaker implantation, including but not limited to bleeding, infection, damage to heart or lungs, heart attack, stroke, or death.  Pt verbalized understanding and agrees to proceed. MDT dual chamber pacemaker implant today. -Will resume metoprolol after implant.   Nobie Putnam, MD

## 2023-06-14 NOTE — Discharge Instructions (Signed)
 After Your Pacemaker   You have a [employer] Pacemaker  ACTIVITY Do not lift your arm above shoulder height for 1 week after your procedure. After 7 days, you may progress as below.  You should remove your sling 24 hours after your procedure, unless otherwise instructed by your provider.     Monday June 21, 2023  Tuesday June 22, 2023 Wednesday June 23, 2023 Thursday June 24, 2023   Do not lift, push, pull, or carry anything over 10 pounds with the affected arm until 6 weeks (Monday July 26, 2023 ) after your procedure.   You may drive AFTER your wound check, unless you have been told otherwise by your provider.   Ask your healthcare provider when you can go back to work   INCISION/Dressing   If large square, outer bandage is left in place, this can be removed after 24 hours from your procedure. Do not remove steri-strips or glue as below.   If a PRESSURE DRESSING (a bulky dressing that usually goes up over your shoulder) was applied or left in place, please follow instructions given by your provider on when to return to have this removed.   Monitor your Pacemaker site for redness, swelling, and drainage. Call the device clinic at [phone] if you experience these symptoms or fever/chills.  If your incision is sealed with Steri-strips or staples, you may shower 7 days after your procedure or when told by your provider. Do not remove the steri-strips or let the shower hit directly on your site. You may wash around your site with soap and water.    If you were discharged in a sling, please do not wear this during the day more than 48 hours after your surgery unless otherwise instructed. This may increase the risk of stiffness and soreness in your shoulder.   Avoid lotions, ointments, or perfumes over your incision until it is well-healed.  You may use a hot tub or a pool AFTER your wound check appointment if the incision is completely closed.  Pacemaker Alerts:  Some alerts  are vibratory and others beep. These are NOT emergencies. Please call our office to let us know. If this occurs at night or on weekends, it can wait until the next business day. Send a remote transmission.  If your device is capable of reading fluid status (for heart failure), you will be offered monthly monitoring to review this with you.   DEVICE MANAGEMENT Remote monitoring is used to monitor your pacemaker from home. This monitoring is scheduled every 91 days by our office. It allows [place] to keep an eye on the functioning of your device to ensure it is working properly. You will routinely see your Electrophysiologist annually (more often if necessary).   You should receive your ID card for your new device in 4-8 weeks. Keep this card with you at all times once received. Consider wearing a medical alert bracelet or necklace.  Your Pacemaker may be MRI compatible. This will be discussed at your next office visit/wound check.  You should avoid contact with strong electric or magnetic fields.   Do not use amateur (ham) radio equipment or electric (arc) welding torches. MP3 player headphones with magnets should not be used. Some devices are safe to use if held at least 12 inches (30 cm) from your Pacemaker. These include power tools, lawn mowers, and speakers. If you are unsure if something is safe to use, ask your health care provider.  When using your cell phone, hold  it to the ear that is on the opposite side from the Pacemaker. Do not leave your cell phone in a pocket over the Pacemaker.  You may safely use electric blankets, heating pads, computers, and microwave ovens.  Call the office right away if: You have chest pain. You feel more short of breath than you have felt before. You feel more light-headed than you have felt before. Your incision starts to open up.  This information is not intended to replace advice given to you by your health care provider. Make sure you discuss any questions  you have with your health care provider.

## 2023-06-22 DIAGNOSIS — E1169 Type 2 diabetes mellitus with other specified complication: Secondary | ICD-10-CM | POA: Diagnosis not present

## 2023-06-22 DIAGNOSIS — M19012 Primary osteoarthritis, left shoulder: Secondary | ICD-10-CM | POA: Diagnosis not present

## 2023-06-22 DIAGNOSIS — D72829 Elevated white blood cell count, unspecified: Secondary | ICD-10-CM | POA: Diagnosis not present

## 2023-06-22 DIAGNOSIS — Z6827 Body mass index (BMI) 27.0-27.9, adult: Secondary | ICD-10-CM | POA: Diagnosis not present

## 2023-06-22 DIAGNOSIS — R748 Abnormal levels of other serum enzymes: Secondary | ICD-10-CM | POA: Diagnosis not present

## 2023-06-22 DIAGNOSIS — E785 Hyperlipidemia, unspecified: Secondary | ICD-10-CM | POA: Diagnosis not present

## 2023-06-30 ENCOUNTER — Ambulatory Visit: Attending: Internal Medicine

## 2023-06-30 DIAGNOSIS — R55 Syncope and collapse: Secondary | ICD-10-CM

## 2023-06-30 LAB — CUP PACEART INCLINIC DEVICE CHECK
Date Time Interrogation Session: 20250319210055
Implantable Lead Connection Status: 753985
Implantable Lead Connection Status: 753985
Implantable Lead Implant Date: 20250303
Implantable Lead Implant Date: 20250303
Implantable Lead Location: 753859
Implantable Lead Location: 753860
Implantable Lead Model: 3830
Implantable Lead Model: 5076
Implantable Pulse Generator Implant Date: 20250303

## 2023-06-30 NOTE — Progress Notes (Signed)
 Normal dual chamber pacemaker wound check. Presenting rhythm: AP/VS. Wound well healed. Routine testing performed. Thresholds, sensing, and impedances consistent with implant measurements and at 3.5V safety margin/auto capture until 3 month visit. No episodes. Reviewed arm restrictions to continue for 6 weeks total post op.  Pt enrolled in remote follow-up.

## 2023-06-30 NOTE — Patient Instructions (Signed)

## 2023-07-09 DIAGNOSIS — R17 Unspecified jaundice: Secondary | ICD-10-CM | POA: Diagnosis not present

## 2023-07-22 DIAGNOSIS — Z6827 Body mass index (BMI) 27.0-27.9, adult: Secondary | ICD-10-CM | POA: Diagnosis not present

## 2023-07-22 DIAGNOSIS — K219 Gastro-esophageal reflux disease without esophagitis: Secondary | ICD-10-CM | POA: Diagnosis not present

## 2023-07-29 DIAGNOSIS — R109 Unspecified abdominal pain: Secondary | ICD-10-CM | POA: Diagnosis not present

## 2023-07-29 DIAGNOSIS — N400 Enlarged prostate without lower urinary tract symptoms: Secondary | ICD-10-CM | POA: Diagnosis not present

## 2023-07-29 DIAGNOSIS — E876 Hypokalemia: Secondary | ICD-10-CM | POA: Diagnosis not present

## 2023-07-29 DIAGNOSIS — K529 Noninfective gastroenteritis and colitis, unspecified: Secondary | ICD-10-CM | POA: Diagnosis not present

## 2023-07-29 DIAGNOSIS — R1084 Generalized abdominal pain: Secondary | ICD-10-CM | POA: Diagnosis not present

## 2023-07-29 DIAGNOSIS — K567 Ileus, unspecified: Secondary | ICD-10-CM | POA: Diagnosis not present

## 2023-07-29 DIAGNOSIS — K6389 Other specified diseases of intestine: Secondary | ICD-10-CM | POA: Diagnosis not present

## 2023-07-29 DIAGNOSIS — R110 Nausea: Secondary | ICD-10-CM | POA: Diagnosis not present

## 2023-07-29 DIAGNOSIS — R1111 Vomiting without nausea: Secondary | ICD-10-CM | POA: Diagnosis not present

## 2023-07-29 DIAGNOSIS — E860 Dehydration: Secondary | ICD-10-CM | POA: Diagnosis not present

## 2023-07-29 DIAGNOSIS — R112 Nausea with vomiting, unspecified: Secondary | ICD-10-CM | POA: Diagnosis not present

## 2023-07-29 DIAGNOSIS — K56609 Unspecified intestinal obstruction, unspecified as to partial versus complete obstruction: Secondary | ICD-10-CM | POA: Diagnosis not present

## 2023-07-29 DIAGNOSIS — K56600 Partial intestinal obstruction, unspecified as to cause: Secondary | ICD-10-CM | POA: Diagnosis not present

## 2023-07-29 DIAGNOSIS — E7800 Pure hypercholesterolemia, unspecified: Secondary | ICD-10-CM | POA: Diagnosis not present

## 2023-07-29 DIAGNOSIS — I441 Atrioventricular block, second degree: Secondary | ICD-10-CM | POA: Diagnosis not present

## 2023-07-29 DIAGNOSIS — E119 Type 2 diabetes mellitus without complications: Secondary | ICD-10-CM | POA: Diagnosis not present

## 2023-07-29 DIAGNOSIS — E871 Hypo-osmolality and hyponatremia: Secondary | ICD-10-CM | POA: Diagnosis not present

## 2023-07-29 DIAGNOSIS — R1110 Vomiting, unspecified: Secondary | ICD-10-CM | POA: Diagnosis not present

## 2023-07-29 DIAGNOSIS — I10 Essential (primary) hypertension: Secondary | ICD-10-CM | POA: Diagnosis not present

## 2023-07-30 ENCOUNTER — Encounter

## 2023-07-30 ENCOUNTER — Ambulatory Visit (INDEPENDENT_AMBULATORY_CARE_PROVIDER_SITE_OTHER)

## 2023-07-30 DIAGNOSIS — I441 Atrioventricular block, second degree: Secondary | ICD-10-CM | POA: Diagnosis not present

## 2023-08-01 LAB — LAB REPORT - SCANNED: EGFR: 86

## 2023-08-02 LAB — CUP PACEART REMOTE DEVICE CHECK
Battery Remaining Longevity: 168 mo
Battery Voltage: 3.22 V
Brady Statistic AP VP Percent: 0.33 %
Brady Statistic AP VS Percent: 38.62 %
Brady Statistic AS VP Percent: 0.14 %
Brady Statistic AS VS Percent: 60.91 %
Brady Statistic RA Percent Paced: 39.42 %
Brady Statistic RV Percent Paced: 0.47 %
Date Time Interrogation Session: 20250420163448
Implantable Lead Connection Status: 753985
Implantable Lead Connection Status: 753985
Implantable Lead Implant Date: 20250303
Implantable Lead Implant Date: 20250303
Implantable Lead Location: 753859
Implantable Lead Location: 753860
Implantable Lead Model: 3830
Implantable Lead Model: 5076
Implantable Pulse Generator Implant Date: 20250303
Lead Channel Impedance Value: 380 Ohm
Lead Channel Impedance Value: 494 Ohm
Lead Channel Impedance Value: 551 Ohm
Lead Channel Impedance Value: 646 Ohm
Lead Channel Pacing Threshold Amplitude: 1 V
Lead Channel Pacing Threshold Amplitude: 1.5 V
Lead Channel Pacing Threshold Pulse Width: 0.4 ms
Lead Channel Pacing Threshold Pulse Width: 0.4 ms
Lead Channel Sensing Intrinsic Amplitude: 15.75 mV
Lead Channel Sensing Intrinsic Amplitude: 15.75 mV
Lead Channel Sensing Intrinsic Amplitude: 2.625 mV
Lead Channel Sensing Intrinsic Amplitude: 2.625 mV
Lead Channel Setting Pacing Amplitude: 3.5 V
Lead Channel Setting Pacing Amplitude: 3.5 V
Lead Channel Setting Pacing Pulse Width: 0.4 ms
Lead Channel Setting Sensing Sensitivity: 1.2 mV
Zone Setting Status: 755011
Zone Setting Status: 755011

## 2023-08-10 DIAGNOSIS — Z7689 Persons encountering health services in other specified circumstances: Secondary | ICD-10-CM | POA: Diagnosis not present

## 2023-08-10 DIAGNOSIS — B999 Unspecified infectious disease: Secondary | ICD-10-CM | POA: Diagnosis not present

## 2023-08-10 DIAGNOSIS — E785 Hyperlipidemia, unspecified: Secondary | ICD-10-CM | POA: Diagnosis not present

## 2023-08-10 DIAGNOSIS — K567 Ileus, unspecified: Secondary | ICD-10-CM | POA: Diagnosis not present

## 2023-08-10 DIAGNOSIS — Z6827 Body mass index (BMI) 27.0-27.9, adult: Secondary | ICD-10-CM | POA: Diagnosis not present

## 2023-08-10 DIAGNOSIS — N179 Acute kidney failure, unspecified: Secondary | ICD-10-CM | POA: Diagnosis not present

## 2023-08-10 DIAGNOSIS — E1169 Type 2 diabetes mellitus with other specified complication: Secondary | ICD-10-CM | POA: Diagnosis not present

## 2023-08-11 DIAGNOSIS — Z7689 Persons encountering health services in other specified circumstances: Secondary | ICD-10-CM | POA: Diagnosis not present

## 2023-08-11 DIAGNOSIS — N179 Acute kidney failure, unspecified: Secondary | ICD-10-CM | POA: Diagnosis not present

## 2023-09-08 DIAGNOSIS — Z6827 Body mass index (BMI) 27.0-27.9, adult: Secondary | ICD-10-CM | POA: Diagnosis not present

## 2023-09-08 DIAGNOSIS — E785 Hyperlipidemia, unspecified: Secondary | ICD-10-CM | POA: Diagnosis not present

## 2023-09-08 DIAGNOSIS — N1831 Chronic kidney disease, stage 3a: Secondary | ICD-10-CM | POA: Diagnosis not present

## 2023-09-08 DIAGNOSIS — B999 Unspecified infectious disease: Secondary | ICD-10-CM | POA: Diagnosis not present

## 2023-09-08 DIAGNOSIS — E1169 Type 2 diabetes mellitus with other specified complication: Secondary | ICD-10-CM | POA: Diagnosis not present

## 2023-09-08 DIAGNOSIS — K567 Ileus, unspecified: Secondary | ICD-10-CM | POA: Diagnosis not present

## 2023-09-08 NOTE — Addendum Note (Signed)
 Addended by: Lott Rouleau A on: 09/08/2023 09:04 AM   Modules accepted: Orders

## 2023-09-08 NOTE — Progress Notes (Signed)
 Remote pacemaker transmission.

## 2023-09-16 ENCOUNTER — Ambulatory Visit: Payer: Medicare HMO | Attending: Cardiology | Admitting: Cardiology

## 2023-09-16 ENCOUNTER — Encounter: Payer: Self-pay | Admitting: Cardiology

## 2023-09-16 VITALS — BP 120/74 | HR 50 | Ht 72.0 in | Wt 190.0 lb

## 2023-09-16 DIAGNOSIS — I459 Conduction disorder, unspecified: Secondary | ICD-10-CM | POA: Diagnosis not present

## 2023-09-16 DIAGNOSIS — I2510 Atherosclerotic heart disease of native coronary artery without angina pectoris: Secondary | ICD-10-CM | POA: Diagnosis not present

## 2023-09-16 DIAGNOSIS — E785 Hyperlipidemia, unspecified: Secondary | ICD-10-CM

## 2023-09-16 DIAGNOSIS — R001 Bradycardia, unspecified: Secondary | ICD-10-CM

## 2023-09-16 DIAGNOSIS — Z961 Presence of intraocular lens: Secondary | ICD-10-CM | POA: Insufficient documentation

## 2023-09-16 DIAGNOSIS — E1169 Type 2 diabetes mellitus with other specified complication: Secondary | ICD-10-CM

## 2023-09-16 DIAGNOSIS — E7800 Pure hypercholesterolemia, unspecified: Secondary | ICD-10-CM

## 2023-09-16 NOTE — Patient Instructions (Signed)

## 2023-09-16 NOTE — Progress Notes (Signed)
 Cardiology Office Note:    Date:  09/16/2023   ID:  [name], DOB March 06, 1934, MRN [id number]  PCP:  Harvest Lineman, MD  Cardiologist:  Nelia Balzarine, MD   Referring MD: Harvest Lineman, MD    ASSESSMENT:    1. Atherosclerosis of native coronary artery of native heart without angina pectoris   2. Mild hypercholesterolemia   3. Heart block   4. Type 2 diabetes mellitus with hyperlipidemia (HCC)   5. Bradycardia    PLAN:    In order of problems listed above:  Atherosclerosis: Secondary prevention stressed with the patient.  Importance of compliance with diet medication stressed any vocalized understanding.  He was advised to ambulate to the best of his ability. High-grade AV block: Post permanent pacing doing well.  Pacemaker notes were reviewed and discussed with him and his wife. Mixed dyslipidemia: On lipid-lowering medications followed by primary care.  Goal LDL less than 60. Essential hypertension: Blood pressure is stable and diet was emphasized. Patient will be seen in follow-up appointment in 6 months or earlier if the patient has any concerns.    Medication Adjustments/Labs and Tests Ordered: Current medicines are reviewed at length with the patient today.  Concerns regarding medicines are outlined above.  No orders of the defined types were placed in this encounter.  No orders of the defined types were placed in this encounter.    Chief Complaint  Patient presents with   Follow-up     History of Present Illness:    Adam Brady is a 89 y.o. male.  Patient has past medical history of atherosclerotic vascular disease, high-grade AV block post permanent pacemaker, essential hypertension and mixed dyslipidemia.  He denies any problems at this time and takes care of activities of daily living.  No chest pain orthopnea PND.  At the time of my evaluation, the patient is alert awake oriented and in no distress.  Past Medical History:  Diagnosis Date    Abnormal weight gain    Acute respiratory failure due to COVID-19 (HCC) 04/16/2019   Allergic rhinitis with postnasal drip    Atherosclerosis of aorta (HCC)    Atherosclerosis of native coronary artery without angina pectoris    BMI 28.0-28.9,adult    BMI 29.0-29.9,adult    Bradycardia 12/08/2021   Bradycardia, sinus    CAD (coronary artery disease) 04/16/2019   Calcification of artery    Cardiac murmur 12/08/2021   Carotid artery stenosis    Ceruminosis, bilateral    Chronic constipation    Dyspnea    ED (erectile dysfunction)    Eustachian tube dysfunction, bilateral    Fatigue    History of hiatal hernia    HLD (hyperlipidemia) 04/16/2019   HTN (hypertension) 04/16/2019   Mild hypercholesterolemia    Near syncope    Orthostatic hypotension    Pain and swelling of left lower leg    Pneumonia due to COVID-19 virus 04/16/2019   Postoperative examination 05/11/2016   Ptosis of right upper eyelid 12/08/2021   Rickettsiosis, tick-borne    Stage 3a chronic kidney disease (CKD) (HCC)    Stage 3a chronic kidney disease (CKD) (HCC)    Thyroid  nodule    Type 2 diabetes mellitus with hyperlipidemia (HCC)    Type 2 diabetes mellitus with hyperlipidemia (HCC)    Vertigo, labyrinthine     Past Surgical History:  Procedure Laterality Date   CARDIAC CATHETERIZATION     CHOLECYSTECTOMY, LAPAROSCOPIC     HERNIA REPAIR  X 3   PACEMAKER IMPLANT N/A 06/14/2023   Procedure: PACEMAKER IMPLANT;  Surgeon: Ardeen Kohler, MD;  Location: [facility] INVASIVE CV LAB;  Service: Cardiovascular;  Laterality: N/A;    Current Medications: Current Meds  Medication Sig   albuterol  (VENTOLIN  HFA) 108 (90 Base) MCG/ACT inhaler Inhale 2 puffs into the lungs 2 (two) times daily as needed for wheezing or shortness of breath.   Apoaequorin (PREVAGEN PO) Take 1 tablet by mouth daily.   aspirin  500 MG EC tablet Take 500 mg by mouth as needed for pain.   Coenzyme Q10 200 MG capsule Take 200 mg by mouth daily.    ferrous sulfate  325 (65 FE) MG tablet Take 325 mg by mouth daily with breakfast.   FIBER ADULT GUMMIES PO Take 7 g by mouth daily.   GARLIC PO Take 2,400 mg by mouth daily.   LINZESS  290 MCG CAPS capsule Take 290 mcg by mouth daily.   Meclizine HCl 25 MG CHEW Chew 1 tablet by mouth 2 (two) times daily as needed for dizziness.   metFORMIN (GLUCOPHAGE-XR) 500 MG 24 hr tablet Take 1,000 mg by mouth daily after supper.   metoprolol  succinate (TOPROL -XL) 25 MG 24 hr tablet Take 50 mg by mouth daily.   Multiple Vitamins-Minerals (MULTIVITAMIN/EXTRA VITAMIN D3 PO) Take 1 tablet by mouth daily.   polyethylene glycol (MIRALAX  / GLYCOLAX ) 17 g packet Take 17 g by mouth daily as needed for moderate constipation.   simvastatin  (ZOCOR ) 40 MG tablet Take 40 mg by mouth at bedtime.   tamsulosin  (FLOMAX ) 0.4 MG CAPS capsule Take 0.4 mg by mouth daily.   traMADol  (ULTRAM ) 50 MG tablet Take 50 mg by mouth 3 (three) times daily as needed for pain.     Allergies:   Nitroglycerin   Social History   Socioeconomic History   Marital status: Married    Spouse name: Betsie   Number of children: Not on file   Years of education: Not on file   Highest education level: Not on file  Occupational History   Not on file  Tobacco Use   Smoking status: Former   Smokeless tobacco: Never  Vaping Use   Vaping status: Never Used  Substance and Sexual Activity   Alcohol use: Not Currently   Drug use: Not Currently   Sexual activity: Not on file  Other Topics Concern   Not on file  Social History Narrative   Not on file   Social Drivers of Health   Financial Resource Strain: Not on file  Food Insecurity: Not on file  Transportation Needs: Not on file  Physical Activity: Not on file  Stress: Not on file  Social Connections: Not on file     Family History: The patient's family history includes CVA in his father; Depression in his mother; Heart disease in his father; Heart failure in his mother;  Hypercholesterolemia in his father; Hypertension in his father; Psychosis in his mother.  ROS:   Please see the history of present illness.    All other systems reviewed and are negative.  EKGs/Labs/Other Studies Reviewed:    The following studies were reviewed today: I discussed my findings with the patient at length   Recent Labs: 05/15/2023: Magnesium 2.1 05/16/2023: ALT 13; TSH 4.634 06/07/2023: BUN 11; Creatinine, Ser 1.01; Hemoglobin 16.1; Platelets 181; Potassium 4.8; Sodium 141  Recent Lipid Panel No results found for: "CHOL", "TRIG", "HDL", "CHOLHDL", "VLDL", "LDLCALC", "LDLDIRECT"  Physical Exam:    VS:  BP 120/74  Pulse (!) 50   Ht 6' (1.829 m)   Wt 190 lb (86.2 kg)   SpO2 96%   BMI 25.77 kg/m     Wt Readings from Last 3 Encounters:  09/16/23 190 lb (86.2 kg)  06/14/23 194 lb (88 kg)  06/07/23 194 lb (88 kg)     GEN: Patient is in no acute distress HEENT: Normal NECK: No JVD; No carotid bruits LYMPHATICS: No lymphadenopathy CARDIAC: Hear sounds regular, 2/6 systolic murmur at the apex. RESPIRATORY:  Clear to auscultation without rales, wheezing or rhonchi  ABDOMEN: Soft, non-tender, non-distended MUSCULOSKELETAL:  No edema; No deformity  SKIN: Warm and dry NEUROLOGIC:  Alert and oriented x 3 PSYCHIATRIC:  Normal affect   Signed, Nelia Balzarine, MD  09/16/2023 2:12 PM    [facility]

## 2023-09-17 ENCOUNTER — Telehealth: Payer: Self-pay | Admitting: Cardiology

## 2023-09-17 ENCOUNTER — Ambulatory Visit: Admitting: Student

## 2023-09-17 NOTE — Telephone Encounter (Signed)
 Patient would like to switch providers from Dr. Daneil Dunker to Dr. Lawana Pray in [place] - due to location of where patient lives.

## 2023-09-23 ENCOUNTER — Ambulatory Visit: Attending: Cardiology

## 2023-09-23 ENCOUNTER — Encounter: Payer: Self-pay | Admitting: Cardiology

## 2023-09-23 VITALS — BP 116/66 | HR 61 | Ht 72.0 in | Wt 185.0 lb

## 2023-09-23 DIAGNOSIS — I495 Sick sinus syndrome: Secondary | ICD-10-CM | POA: Diagnosis not present

## 2023-09-23 DIAGNOSIS — Z950 Presence of cardiac pacemaker: Secondary | ICD-10-CM | POA: Diagnosis not present

## 2023-09-23 DIAGNOSIS — I471 Supraventricular tachycardia, unspecified: Secondary | ICD-10-CM | POA: Diagnosis not present

## 2023-09-23 DIAGNOSIS — I491 Atrial premature depolarization: Secondary | ICD-10-CM

## 2023-09-23 DIAGNOSIS — I493 Ventricular premature depolarization: Secondary | ICD-10-CM | POA: Diagnosis not present

## 2023-09-23 NOTE — Progress Notes (Signed)
 Electrophysiology Office Note:   Date:  09/23/2023  ID:  Adam Brady, DOB 12-05-1933, MRN [id number]  Primary Cardiologist: Nelia Balzarine, MD Electrophysiologist: Lei Pump, MD      History of Present Illness:   Adam Brady is a 89 y.o. male with h/o hypertension, hyperlipidemia, diabetes, aortic atherosclerosis by imaging, aortic ectasia, very frequent PAC/PVCs, tachycardia-bradycardia syndrome status post pacemaker who is being seen today for 90-day follow-up after pacemaker implant.  Patient reports doing relatively well since pacemaker implant.  He has not noticed a drastic change in symptoms, but his heart rate is more regular.  He is able to be quite active on a daily basis without any symptoms.  His left chest incision has healed well.  He has no tenderness or pain at the site.  He has no new or acute complaints today.   Review of systems complete and found to be negative unless listed in HPI.   EP Information / Studies Reviewed:    EKG is ordered today. Personal review as below.  EKG Interpretation Date/Time:  Thursday September 23 2023 16:10:50 EDT Ventricular Rate:  61 PR Interval:  190 QRS Duration:  110 QT Interval:  416 QTC Calculation: 418 R Axis:   -56  Text Interpretation: Normal sinus rhythm and atrial paced rhythm Possible Anterior infarct , age undetermined When compared with ECG of 14-Jun-2023 16:48, Premature ventricular complexes are no longer Present Vent. rate has decreased BY  36 BPM Left anterior fascicular block is now Present Right bundle branch block is no longer Present Atrial paced beats present Confirmed by Ardeen Kohler [phone]) on 09/23/2023 10:21:52 PM   Echo 05/15/23:   1. Left ventricular ejection fraction, by estimation, is 50 to 55%. Left  ventricular ejection fraction by PLAX is 54 %. The left ventricle has low  normal function. The left ventricle demonstrates global hypokinesis. Left  ventricular diastolic function  could not be  evaluated.   2. Right ventricular systolic function is normal. The right ventricular  size is normal. Tricuspid regurgitation signal is inadequate for assessing  PA pressure.   3. Right atrial size was mildly dilated.   4. The mitral valve is grossly normal. Trivial mitral valve  regurgitation.   5. The aortic valve is tricuspid. Aortic valve regurgitation is trivial.  Aortic valve sclerosis/calcification is present, without any evidence of  aortic stenosis.   6. The inferior vena cava is dilated in size with >50% respiratory  variability, suggesting right atrial pressure of 8 mmHg.   Zio 04/2023:  Patch Wear Time:  13 days and 23 hours (2025-01-10T11:56:50-0500 to 2025-01-24T10:59:37-0500)  Patient had a min HR of 22 bpm, max HR of 194 bpm, and avg HR of 75 bpm.    Predominant underlying rhythm was Sinus Rhythm.   2 Pauses occurred, the longest lasting 6.2 secs (10 bpm). Pauses occurred due to possible High Grade AV Block, Second Degree AV Block and Non-Conducted SVE(s). 14 episode(s) of AV Block (2nd) occurred, lasting a total of 53 secs. Second Degree AV Block-Mobitz I (Wenckebach) was present. Isolated SVEs were frequent (20.7%, [id number]), SVE Couplets were rare (<1.0%, 4197), and SVE Triplets were rare (<1.0%, 1623).    Isolated VEs were occasional (3.9%, [id number]), VE Couplets were rare (<1.0%, 2806), and VE Triplets were rare (<1.0%, 119). Ventricular Bigeminy and Trigeminy were present. MD notification criteria for High Grade AV Block (6.2 seconds of Ventricular Asystole) and Second Degree AV Block met - report posted prior to notification per account request (DI).3  Ventricular Tachycardia runs occurred, the run with the fastest interval lasting 6 beats with a max rate of 194 bpm (avg 167 bpm); the run with the fastest interval was also the longest. 537 Supraventricular Tachycardia (SVT) runs occurred, the run with the fastest interval lasting 21.3 secs with a max rate of 174 bpm, the  longest lasting 39.0 secs with an avg rate of 128 bpm.    Physical Exam:   VS:  BP 116/66   Pulse 61   Ht 6' (1.829 m)   Wt 185 lb (83.9 kg)   SpO2 94%   BMI 25.09 kg/m    Wt Readings from Last 3 Encounters:  09/23/23 185 lb (83.9 kg)  09/16/23 190 lb (86.2 kg)  06/14/23 194 lb (88 kg)     GEN: Well nourished, well developed in no acute distress NECK: No JVD CARDIAC: Normal rate, regular rhythm.  Well-healed left chest pacer pocket. RESPIRATORY:  Clear to auscultation without rales, wheezing or rhonchi  ABDOMEN: Soft, non-distended EXTREMITIES:  No edema; No deformity   ASSESSMENT AND PLAN:   [name] is a 89 y.o. male with a hx of hypertension, hyperlipidemia, diabetes, aortic atherosclerosis by imaging, aortic ectasia, very frequent PAC/PVCs who is being evaluated for abnormal ZIO monitor.  Patient was found to have an irregular rhythm by his PCP was sent to the ED for evaluation where he was found to have frequent PACs and PVCs.  He was started on metoprolol  tartrate 12.5 mg twice daily.  Zio monitor was ordered which showed multiple episodes of AV block.  Zio monitor was also notable for 537 episodes of SVT, longest lasting 39 seconds, and 20% PAC and 4% PVC burden.  He does report some dizziness and palpitations at home.  He meets criteria for pacemaker implant due to presence of tachycardia-bradycardia syndrome and underwent successful dual-chamber pacemaker implant on 06/14/2023.   # Tachycardia-bradycardia syndrome s/p dual chamber pacemaker: # Frequent PACs # Frequent PVCs # PSVT - In-clinic device interrogation was performed today.  Appropriate device function and stable lead parameters.  Battery okay.  Presenting rhythm AS-VS. Rare brief bursts of SVT.  No programming changes made today.  Atrial pacing 45%.  If burden of atrial pacing increases substantially then may need to consider enabling rate response.  He has no exertional symptoms at this time. -Continue remote  monitoring. - Continue metoprolol  XL 50 mg daily.  There appears to be significant improvement in his burden of ectopy and SVT since resuming metoprolol .  Patient would like to follow-up in our [place] office as this is closer to home.  Follow up with Dr. Lawana Pray in 12 months  Signed, Ardeen Kohler, MD

## 2023-09-23 NOTE — Patient Instructions (Signed)
 Medication Instructions:  Your physician recommends that you continue on your current medications as directed. Please refer to the Current Medication list given to you today.  *If you need a refill on your cardiac medications before your next appointment, please call your pharmacy*   Follow-Up: At [facility], you and your health needs are our priority.  As part of our continuing mission to provide you with exceptional heart care, our providers are all part of one team.  This team includes your primary Cardiologist (physician) and Advanced Practice Providers or APPs (Physician Assistants and Nurse Practitioners) who all work together to provide you with the care you need, when you need it.  Your next appointment:   1 year  Provider:   You may see Will Cortland Ding, MD or one of the following Advanced Practice Providers on your designated Care Team:   Mertha Abrahams, [place] [address]" [place], PA-C Suzann Riddle, NP Creighton Doffing, NP

## 2023-10-13 ENCOUNTER — Encounter: Payer: Self-pay | Admitting: Internal Medicine

## 2023-10-19 ENCOUNTER — Other Ambulatory Visit: Payer: Self-pay

## 2023-10-19 ENCOUNTER — Other Ambulatory Visit: Payer: Self-pay | Admitting: Cardiology

## 2023-10-19 MED ORDER — METOPROLOL SUCCINATE ER 25 MG PO TB24: 50.0000 mg | ORAL_TABLET | Freq: Every day | ORAL | 3 refills | Status: AC

## 2023-10-29 ENCOUNTER — Ambulatory Visit

## 2023-10-29 ENCOUNTER — Encounter

## 2023-10-29 DIAGNOSIS — I441 Atrioventricular block, second degree: Secondary | ICD-10-CM | POA: Diagnosis not present

## 2023-11-02 LAB — CUP PACEART REMOTE DEVICE CHECK
Battery Remaining Longevity: 170 mo
Battery Voltage: 3.2 V
Brady Statistic AP VP Percent: 0.11 %
Brady Statistic AP VS Percent: 48.24 %
Brady Statistic AS VP Percent: 0.12 %
Brady Statistic AS VS Percent: 51.53 %
Brady Statistic RA Percent Paced: 47.74 %
Brady Statistic RV Percent Paced: 0.23 %
Date Time Interrogation Session: 20250718061000
Implantable Lead Connection Status: 753985
Implantable Lead Connection Status: 753985
Implantable Lead Implant Date: 20250303
Implantable Lead Implant Date: 20250303
Implantable Lead Location: 753859
Implantable Lead Location: 753860
Implantable Lead Model: 3830
Implantable Lead Model: 5076
Implantable Pulse Generator Implant Date: 20250303
Lead Channel Impedance Value: 361 Ohm
Lead Channel Impedance Value: 513 Ohm
Lead Channel Impedance Value: 532 Ohm
Lead Channel Impedance Value: 703 Ohm
Lead Channel Pacing Threshold Amplitude: 1 V
Lead Channel Pacing Threshold Amplitude: 1.25 V
Lead Channel Pacing Threshold Pulse Width: 0.4 ms
Lead Channel Pacing Threshold Pulse Width: 0.4 ms
Lead Channel Sensing Intrinsic Amplitude: 17.75 mV
Lead Channel Sensing Intrinsic Amplitude: 17.75 mV
Lead Channel Sensing Intrinsic Amplitude: 2.375 mV
Lead Channel Sensing Intrinsic Amplitude: 2.375 mV
Lead Channel Setting Pacing Amplitude: 2 V
Lead Channel Setting Pacing Amplitude: 2 V
Lead Channel Setting Pacing Pulse Width: 0.4 ms
Lead Channel Setting Sensing Sensitivity: 1.2 mV
Zone Setting Status: 755011
Zone Setting Status: 755011

## 2023-11-03 ENCOUNTER — Ambulatory Visit: Payer: Self-pay | Admitting: Cardiology

## 2023-11-16 DIAGNOSIS — Z6826 Body mass index (BMI) 26.0-26.9, adult: Secondary | ICD-10-CM | POA: Diagnosis not present

## 2023-11-16 DIAGNOSIS — L819 Disorder of pigmentation, unspecified: Secondary | ICD-10-CM | POA: Diagnosis not present

## 2023-12-14 DIAGNOSIS — E785 Hyperlipidemia, unspecified: Secondary | ICD-10-CM | POA: Diagnosis not present

## 2023-12-14 DIAGNOSIS — D485 Neoplasm of uncertain behavior of skin: Secondary | ICD-10-CM | POA: Diagnosis not present

## 2023-12-14 DIAGNOSIS — D509 Iron deficiency anemia, unspecified: Secondary | ICD-10-CM | POA: Diagnosis not present

## 2023-12-14 DIAGNOSIS — N1831 Chronic kidney disease, stage 3a: Secondary | ICD-10-CM | POA: Diagnosis not present

## 2023-12-14 DIAGNOSIS — I152 Hypertension secondary to endocrine disorders: Secondary | ICD-10-CM | POA: Diagnosis not present

## 2023-12-14 DIAGNOSIS — H9201 Otalgia, right ear: Secondary | ICD-10-CM | POA: Diagnosis not present

## 2023-12-14 DIAGNOSIS — H9221 Otorrhagia, right ear: Secondary | ICD-10-CM | POA: Diagnosis not present

## 2023-12-14 DIAGNOSIS — E1169 Type 2 diabetes mellitus with other specified complication: Secondary | ICD-10-CM | POA: Diagnosis not present

## 2023-12-14 DIAGNOSIS — Z0000 Encounter for general adult medical examination without abnormal findings: Secondary | ICD-10-CM | POA: Diagnosis not present

## 2023-12-14 DIAGNOSIS — E1159 Type 2 diabetes mellitus with other circulatory complications: Secondary | ICD-10-CM | POA: Diagnosis not present

## 2024-01-11 DIAGNOSIS — C44222 Squamous cell carcinoma of skin of right ear and external auricular canal: Secondary | ICD-10-CM | POA: Diagnosis not present

## 2024-01-17 NOTE — Progress Notes (Signed)
 Remote PPM Transmission

## 2024-01-28 ENCOUNTER — Encounter

## 2024-01-28 ENCOUNTER — Ambulatory Visit (INDEPENDENT_AMBULATORY_CARE_PROVIDER_SITE_OTHER)

## 2024-01-28 DIAGNOSIS — I441 Atrioventricular block, second degree: Secondary | ICD-10-CM | POA: Diagnosis not present

## 2024-02-01 LAB — CUP PACEART REMOTE DEVICE CHECK
Battery Remaining Longevity: 168 mo
Battery Voltage: 3.17 V
Brady Statistic AP VP Percent: 0.16 %
Brady Statistic AP VS Percent: 48.03 %
Brady Statistic AS VP Percent: 0.11 %
Brady Statistic AS VS Percent: 51.71 %
Brady Statistic RA Percent Paced: 48.64 %
Brady Statistic RV Percent Paced: 0.27 %
Date Time Interrogation Session: 20251017060635
Implantable Lead Connection Status: 753985
Implantable Lead Connection Status: 753985
Implantable Lead Implant Date: 20250303
Implantable Lead Implant Date: 20250303
Implantable Lead Location: 753859
Implantable Lead Location: 753860
Implantable Lead Model: 3830
Implantable Lead Model: 5076
Implantable Pulse Generator Implant Date: 20250303
Lead Channel Impedance Value: 361 Ohm
Lead Channel Impedance Value: 513 Ohm
Lead Channel Impedance Value: 532 Ohm
Lead Channel Impedance Value: 703 Ohm
Lead Channel Pacing Threshold Amplitude: 1.125 V
Lead Channel Pacing Threshold Amplitude: 1.125 V
Lead Channel Pacing Threshold Pulse Width: 0.4 ms
Lead Channel Pacing Threshold Pulse Width: 0.4 ms
Lead Channel Sensing Intrinsic Amplitude: 16.625 mV
Lead Channel Sensing Intrinsic Amplitude: 16.625 mV
Lead Channel Sensing Intrinsic Amplitude: 2.125 mV
Lead Channel Sensing Intrinsic Amplitude: 2.125 mV
Lead Channel Setting Pacing Amplitude: 1.75 V
Lead Channel Setting Pacing Amplitude: 2 V
Lead Channel Setting Pacing Pulse Width: 0.4 ms
Lead Channel Setting Sensing Sensitivity: 1.2 mV
Zone Setting Status: 755011
Zone Setting Status: 755011

## 2024-02-03 NOTE — Progress Notes (Signed)
 Remote PPM Transmission

## 2024-02-05 ENCOUNTER — Ambulatory Visit: Payer: Self-pay | Admitting: Cardiology

## 2024-02-08 DIAGNOSIS — R351 Nocturia: Secondary | ICD-10-CM | POA: Diagnosis not present

## 2024-02-08 DIAGNOSIS — M25641 Stiffness of right hand, not elsewhere classified: Secondary | ICD-10-CM | POA: Diagnosis not present

## 2024-02-08 DIAGNOSIS — T8149XA Infection following a procedure, other surgical site, initial encounter: Secondary | ICD-10-CM | POA: Diagnosis not present

## 2024-02-08 DIAGNOSIS — N401 Enlarged prostate with lower urinary tract symptoms: Secondary | ICD-10-CM | POA: Diagnosis not present

## 2024-02-08 DIAGNOSIS — I951 Orthostatic hypotension: Secondary | ICD-10-CM | POA: Diagnosis not present

## 2024-03-13 DIAGNOSIS — R351 Nocturia: Secondary | ICD-10-CM | POA: Diagnosis not present

## 2024-03-13 DIAGNOSIS — N401 Enlarged prostate with lower urinary tract symptoms: Secondary | ICD-10-CM | POA: Diagnosis not present

## 2024-03-13 DIAGNOSIS — E785 Hyperlipidemia, unspecified: Secondary | ICD-10-CM | POA: Diagnosis not present

## 2024-03-13 DIAGNOSIS — E1169 Type 2 diabetes mellitus with other specified complication: Secondary | ICD-10-CM | POA: Diagnosis not present

## 2024-03-13 DIAGNOSIS — I152 Hypertension secondary to endocrine disorders: Secondary | ICD-10-CM | POA: Diagnosis not present

## 2024-03-13 DIAGNOSIS — E1159 Type 2 diabetes mellitus with other circulatory complications: Secondary | ICD-10-CM | POA: Diagnosis not present

## 2024-03-13 DIAGNOSIS — N1831 Chronic kidney disease, stage 3a: Secondary | ICD-10-CM | POA: Diagnosis not present

## 2024-03-13 DIAGNOSIS — Z6827 Body mass index (BMI) 27.0-27.9, adult: Secondary | ICD-10-CM | POA: Diagnosis not present

## 2024-03-22 DIAGNOSIS — E785 Hyperlipidemia, unspecified: Secondary | ICD-10-CM | POA: Diagnosis not present

## 2024-03-22 DIAGNOSIS — E1169 Type 2 diabetes mellitus with other specified complication: Secondary | ICD-10-CM | POA: Diagnosis not present

## 2024-04-28 ENCOUNTER — Ambulatory Visit

## 2024-04-28 ENCOUNTER — Encounter

## 2024-04-28 DIAGNOSIS — I441 Atrioventricular block, second degree: Secondary | ICD-10-CM | POA: Diagnosis not present

## 2024-05-01 LAB — CUP PACEART REMOTE DEVICE CHECK
Battery Remaining Longevity: 167 mo
Battery Voltage: 3.14 V
Brady Statistic AP VP Percent: 0.17 %
Brady Statistic AP VS Percent: 38.29 %
Brady Statistic AS VP Percent: 0.22 %
Brady Statistic AS VS Percent: 61.33 %
Brady Statistic RA Percent Paced: 38.78 %
Brady Statistic RV Percent Paced: 0.38 %
Date Time Interrogation Session: 20260116002906
Implantable Lead Connection Status: 753985
Implantable Lead Connection Status: 753985
Implantable Lead Implant Date: 20250303
Implantable Lead Implant Date: 20250303
Implantable Lead Location: 753859
Implantable Lead Location: 753860
Implantable Lead Model: 3830
Implantable Lead Model: 5076
Implantable Pulse Generator Implant Date: 20250303
Lead Channel Impedance Value: 380 Ohm
Lead Channel Impedance Value: 513 Ohm
Lead Channel Impedance Value: 551 Ohm
Lead Channel Impedance Value: 741 Ohm
Lead Channel Pacing Threshold Amplitude: 1 V
Lead Channel Pacing Threshold Amplitude: 1 V
Lead Channel Pacing Threshold Pulse Width: 0.4 ms
Lead Channel Pacing Threshold Pulse Width: 0.4 ms
Lead Channel Sensing Intrinsic Amplitude: 17.875 mV
Lead Channel Sensing Intrinsic Amplitude: 17.875 mV
Lead Channel Sensing Intrinsic Amplitude: 2.5 mV
Lead Channel Sensing Intrinsic Amplitude: 2.5 mV
Lead Channel Setting Pacing Amplitude: 1.5 V
Lead Channel Setting Pacing Amplitude: 2 V
Lead Channel Setting Pacing Pulse Width: 0.4 ms
Lead Channel Setting Sensing Sensitivity: 1.2 mV
Zone Setting Status: 755011
Zone Setting Status: 755011

## 2024-05-04 ENCOUNTER — Ambulatory Visit: Payer: Self-pay | Admitting: Cardiology

## 2024-05-04 NOTE — Progress Notes (Signed)
 Remote PPM Transmission

## 2024-06-14 ENCOUNTER — Ambulatory Visit: Admitting: Cardiology

## 2024-07-28 ENCOUNTER — Encounter
